# Patient Record
Sex: Female | Born: 1990 | Race: Black or African American | Hispanic: No | Marital: Single | Smoking: Former smoker
Health system: Southern US, Community
[De-identification: ages and names within clinical notes are randomized; demographics above are authoritative.]

## PROBLEM LIST (undated history)

## (undated) ENCOUNTER — Inpatient Hospital Stay (HOSPITAL_COMMUNITY): Payer: Self-pay

## (undated) DIAGNOSIS — N289 Disorder of kidney and ureter, unspecified: Secondary | ICD-10-CM

---
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
## 2011-02-01 ENCOUNTER — Emergency Department: Payer: Self-pay | Admitting: Emergency Medicine

## 2013-03-19 ENCOUNTER — Emergency Department: Payer: Self-pay | Admitting: Emergency Medicine

## 2013-03-19 LAB — COMPREHENSIVE METABOLIC PANEL
Albumin: 3.7 g/dL (ref 3.4–5.0)
Alkaline Phosphatase: 96 U/L (ref 50–136)
BUN: 8 mg/dL (ref 7–18)
Calcium, Total: 9.2 mg/dL (ref 8.5–10.1)
Chloride: 107 mmol/L (ref 98–107)
Co2: 24 mmol/L (ref 21–32)
EGFR (African American): >60
Glucose: 124 mg/dL — ABNORMAL HIGH (ref 65–99)
Osmolality: 274 (ref 275–301)
Potassium: 4 mmol/L (ref 3.5–5.1)
SGOT(AST): 20 U/L (ref 15–37)
Total Protein: 8.3 g/dL — ABNORMAL HIGH (ref 6.4–8.2)

## 2013-03-19 LAB — URINALYSIS, COMPLETE
Bilirubin,UR: NEGATIVE
Glucose,UR: NEGATIVE mg/dL (ref 0–75)
Ketone: NEGATIVE
Protein: 100
Squamous Epithelial: 6

## 2013-03-19 LAB — CBC
HGB: 12.7 g/dL (ref 12.0–16.0)
MCH: 27.8 pg (ref 26.0–34.0)
MCHC: 32.9 g/dL (ref 32.0–36.0)
Platelet: 221 10*3/uL (ref 150–440)
WBC: 11.8 10*3/uL — ABNORMAL HIGH (ref 3.6–11.0)

## 2013-03-19 LAB — LIPASE, BLOOD: Lipase: 99 U/L (ref 73–393)

## 2013-03-19 LAB — PREGNANCY, URINE: Pregnancy Test, Urine: NEGATIVE m[IU]/mL

## 2013-03-21 LAB — URINE CULTURE

## 2013-08-18 ENCOUNTER — Encounter (HOSPITAL_COMMUNITY): Payer: Self-pay | Admitting: Emergency Medicine

## 2013-08-18 ENCOUNTER — Emergency Department (HOSPITAL_COMMUNITY)
Admission: EM | Admit: 2013-08-18 | Discharge: 2013-08-19 | Disposition: A | Discharge status: Home / Self Care | Payer: Self-pay | Attending: Emergency Medicine | Admitting: Emergency Medicine

## 2013-08-18 DIAGNOSIS — N390 Urinary tract infection, site not specified: Secondary | ICD-10-CM | POA: Insufficient documentation

## 2013-08-18 DIAGNOSIS — Z880 Allergy status to penicillin: Secondary | ICD-10-CM | POA: Insufficient documentation

## 2013-08-18 DIAGNOSIS — Z87891 Personal history of nicotine dependence: Secondary | ICD-10-CM | POA: Insufficient documentation

## 2013-08-18 DIAGNOSIS — Z3490 Encounter for supervision of normal pregnancy, unspecified, unspecified trimester: Secondary | ICD-10-CM

## 2013-08-18 DIAGNOSIS — O2390 Unspecified genitourinary tract infection in pregnancy, unspecified trimester: Secondary | ICD-10-CM | POA: Insufficient documentation

## 2013-08-18 DIAGNOSIS — O219 Vomiting of pregnancy, unspecified: Secondary | ICD-10-CM

## 2013-08-18 DIAGNOSIS — O210 Mild hyperemesis gravidarum: Secondary | ICD-10-CM | POA: Insufficient documentation

## 2013-08-18 HISTORY — DX: Disorder of kidney and ureter, unspecified: N28.9

## 2013-08-18 LAB — URINALYSIS, ROUTINE W REFLEX MICROSCOPIC
Bilirubin Urine: NEGATIVE
GLUCOSE, UA: NEGATIVE mg/dL
Hgb urine dipstick: NEGATIVE
KETONES UR: NEGATIVE mg/dL
NITRITE: NEGATIVE
Protein, ur: NEGATIVE mg/dL
Specific Gravity, Urine: 1.023 (ref 1.005–1.030)
Urobilinogen, UA: 0.2 mg/dL (ref 0.0–1.0)
pH: 6 (ref 5.0–8.0)

## 2013-08-18 LAB — COMPREHENSIVE METABOLIC PANEL
ALK PHOS: 62 U/L (ref 39–117)
ALT: 12 U/L (ref 0–35)
AST: 16 U/L (ref 0–37)
Albumin: 3.7 g/dL (ref 3.5–5.2)
BUN: 8 mg/dL (ref 6–23)
CHLORIDE: 101 meq/L (ref 96–112)
CO2: 22 mEq/L (ref 19–32)
Calcium: 9.3 mg/dL (ref 8.4–10.5)
Creatinine, Ser: 0.66 mg/dL (ref 0.50–1.10)
GFR calc non Af Amer: >90 mL/min (ref >90)
GLUCOSE: 82 mg/dL (ref 70–99)
POTASSIUM: 3.9 meq/L (ref 3.7–5.3)
Sodium: 136 mEq/L — ABNORMAL LOW (ref 137–147)
Total Bilirubin: 0.2 mg/dL — ABNORMAL LOW (ref 0.3–1.2)
Total Protein: 8.1 g/dL (ref 6.0–8.3)

## 2013-08-18 LAB — CBC WITH DIFFERENTIAL/PLATELET
Basophils Absolute: 0 10*3/uL (ref 0.0–0.1)
Basophils Relative: 0 % (ref 0–1)
Eosinophils Absolute: 0.1 10*3/uL (ref 0.0–0.7)
Eosinophils Relative: 1 % (ref 0–5)
HCT: 37.3 % (ref 36.0–46.0)
HEMOGLOBIN: 12.6 g/dL (ref 12.0–15.0)
LYMPHS ABS: 2.8 10*3/uL (ref 0.7–4.0)
Lymphocytes Relative: 28 % (ref 12–46)
MCH: 28.8 pg (ref 26.0–34.0)
MCHC: 33.8 g/dL (ref 30.0–36.0)
MCV: 85.2 fL (ref 78.0–100.0)
MONOS PCT: 7 % (ref 3–12)
Monocytes Absolute: 0.6 10*3/uL (ref 0.1–1.0)
NEUTROS ABS: 6.4 10*3/uL (ref 1.7–7.7)
NEUTROS PCT: 64 % (ref 43–77)
Platelets: 259 10*3/uL (ref 150–400)
RBC: 4.38 MIL/uL (ref 3.87–5.11)
RDW: 15.1 % (ref 11.5–15.5)
WBC: 9.9 10*3/uL (ref 4.0–10.5)

## 2013-08-18 LAB — URINE MICROSCOPIC-ADD ON

## 2013-08-18 LAB — PREGNANCY, URINE: PREG TEST UR: POSITIVE — AB

## 2013-08-18 MED ORDER — PROMETHAZINE HCL 25 MG PO TABS: 25.0000 mg | ORAL_TABLET | Freq: Four times a day (QID) | ORAL | Status: DC | PRN

## 2013-08-18 MED ORDER — NITROFURANTOIN MONOHYD MACRO 100 MG PO CAPS: 100.0000 mg | ORAL_CAPSULE | Freq: Two times a day (BID) | ORAL | Status: DC

## 2013-08-18 MED ORDER — GI COCKTAIL ~~LOC~~
30.0000 mL | Freq: Once | ORAL | Status: AC
  Administered 2013-08-18: 30 mL via ORAL
  Filled 2013-08-18: qty 30

## 2013-08-18 MED ORDER — ONDANSETRON 8 MG PO TBDP: ORAL_TABLET | ORAL | Status: DC

## 2013-08-18 MED ORDER — FLUCONAZOLE 200 MG PO TABS: 200.0000 mg | ORAL_TABLET | Freq: Every day | ORAL | Status: AC

## 2013-08-18 MED ORDER — ONDANSETRON 4 MG PO TBDP
8.0000 mg | ORAL_TABLET | Freq: Once | ORAL | Status: AC
  Administered 2013-08-18: 8 mg via ORAL
  Filled 2013-08-18: qty 2

## 2013-08-18 MED ORDER — FLUCONAZOLE 200 MG PO TABS: 200.0000 mg | ORAL_TABLET | Freq: Every day | ORAL | Status: DC

## 2013-08-18 MED ORDER — PRENATAL COMPLETE 14-0.4 MG PO TABS: 1.0000 | ORAL_TABLET | Freq: Every day | ORAL | Status: DC

## 2013-08-18 NOTE — ED Provider Notes (Signed)
CSN: [id number]     Arrival date & time 08/18/13  1606 History   First MD Initiated Contact with Patient 08/18/13 2159     Chief Complaint  Patient presents with  . Abdominal Pain   HPI  History provided by the patient. Patient is a 23 year old female with no significant PMH who presents with complaints of continued intermittent abdominal pains with nausea and vomiting symptoms. Symptoms have been present for the past 3 weeks to one month. She reports intermittent episodes of nausea and vomiting throughout the day as well as upper abdominal pain and cramping. Patient states she is slightly concerned for possible pregnancy. Her last normal visual cycle was in November. She did take several home pregnancy tests which have been negative. She normally has fairly regular menstrual cycles each month. She denies having any small amounts of vaginal bleeding or any vaginal discharge. No vaginal irritations or pain. Denies any dysuria, hematuria urinary frequency. No diarrhea or constipation. No fever, chills or sweats. She has not taken any medications or treatment for symptoms. No other aggravating or alleviating factors. No other associated symptoms.    Past Medical History  Diagnosis Date  . Renal disorder    History reviewed. No pertinent past surgical history. No family history on file. History  Substance Use Topics  . Smoking status: Former Games developer  . Smokeless tobacco: Not on file  . Alcohol Use: No   OB History   Grav Para Term Preterm Abortions TAB SAB Ect Mult Living                 Review of Systems  Constitutional: Positive for appetite change. Negative for fever, chills, diaphoresis and fatigue.  Respiratory: Negative for shortness of breath.   Cardiovascular: Negative for chest pain.  Gastrointestinal: Positive for nausea, vomiting and abdominal pain.  Genitourinary: Negative for dysuria, frequency, hematuria, flank pain, vaginal bleeding and vaginal discharge.  All other  systems reviewed and are negative.    Allergies  Penicillins  Home Medications  No current outpatient prescriptions on file. BP 125/82  Pulse 61  Temp(Src) 98 F (36.7 C) (Oral)  Resp 20  Ht 5\' 9"  (1.753 m)  Wt 245 lb (111.131 kg)  BMI 36.16 kg/m2  SpO2 100%  LMP 06/16/2013 Physical Exam  Nursing note and vitals reviewed. Constitutional: She is oriented to person, place, and time. She appears well-developed and well-nourished. No distress.  HENT:  Head: Normocephalic.  Cardiovascular: Normal rate and regular rhythm.   Pulmonary/Chest: Effort normal and breath sounds normal. No respiratory distress. She has no wheezes. She has no rales.  Abdominal: Soft. There is tenderness in the left upper quadrant. There is no rigidity, no rebound, no guarding, no CVA tenderness, no tenderness at McBurney's point and negative Murphy's sign.  Musculoskeletal: Normal range of motion. She exhibits no edema and no tenderness.  Neurological: She is alert and oriented to person, place, and time.  Skin: Skin is warm and dry. No rash noted.  Psychiatric: She has a normal mood and affect. Her behavior is normal.    ED Course  Procedures   DIAGNOSTIC STUDIES: Oxygen Saturation is 100% on room air.    COORDINATION OF CARE:  Nursing notes reviewed. Vital signs reviewed. Initial pt interview and examination performed.   10:20 PM-patient seen and evaluated. She is well-appearing no acute distress. She has soft abdomen with mild pains in the left upper abdomen. No peritoneal signs. Discussed work up plan with pt at bedside, which includes lab  testing UA. I did also discuss options for pelvic exam despite lack of symptoms. Patient has refused this. Pt agrees with plan.  11:00 PM I discussed with patient her positive pregnancy test. I again offered further evaluation including possible ultrasound testing which she did not wish to have at this time. I did use a bedside ultrasound which demonstrated  appearance of intrauterine pregnancy. No detectable heartbeat at this time. Based on last menstrual period patient may be 4-6 weeks. I shortly encourage close followup with an OB/GYN specialist. [facility] She was also given strict return precautions and agrees.   Treatment plan initiated: Medications  ondansetron (ZOFRAN-ODT) disintegrating tablet 8 mg (8 mg Oral Given 08/18/13 2217)  gi cocktail (Maalox,Lidocaine,Donnatal) (30 mLs Oral Given 08/18/13 2217)   Results for orders placed during the hospital encounter of 08/18/13  CBC WITH DIFFERENTIAL      Result Value Range   WBC 9.9  4.0 - 10.5 K/uL   RBC 4.38  3.87 - 5.11 MIL/uL   Hemoglobin 12.6  12.0 - 15.0 g/dL   HCT 40.937.3  81.136.0 - 91.446.0 %   MCV 85.2  78.0 - 100.0 fL   MCH 28.8  26.0 - 34.0 pg   MCHC 33.8  30.0 - 36.0 g/dL   RDW 78.215.1  95.611.5 - 21.315.5 %   Platelets 259  150 - 400 K/uL   Neutrophils Relative % 64  43 - 77 %   Neutro Abs 6.4  1.7 - 7.7 K/uL   Lymphocytes Relative 28  12 - 46 %   Lymphs Abs 2.8  0.7 - 4.0 K/uL   Monocytes Relative 7  3 - 12 %   Monocytes Absolute 0.6  0.1 - 1.0 K/uL   Eosinophils Relative 1  0 - 5 %   Eosinophils Absolute 0.1  0.0 - 0.7 K/uL   Basophils Relative 0  0 - 1 %   Basophils Absolute 0.0  0.0 - 0.1 K/uL  COMPREHENSIVE METABOLIC PANEL      Result Value Range   Sodium 136 (*) 137 - 147 mEq/L   Potassium 3.9  3.7 - 5.3 mEq/L   Chloride 101  96 - 112 mEq/L   CO2 22  19 - 32 mEq/L   Glucose, Bld 82  70 - 99 mg/dL   BUN 8  6 - 23 mg/dL   Creatinine, Ser 0.860.66  0.50 - 1.10 mg/dL   Calcium 9.3  8.4 - 57.810.5 mg/dL   Total Protein 8.1  6.0 - 8.3 g/dL   Albumin 3.7  3.5 - 5.2 g/dL   AST 16  0 - 37 U/L   ALT 12  0 - 35 U/L   Alkaline Phosphatase 62  39 - 117 U/L   Total Bilirubin 0.2 (*) 0.3 - 1.2 mg/dL   GFR calc non Af Amer >90  >90 mL/min   GFR calc Af Amer >90  >90 mL/min  URINALYSIS, ROUTINE W REFLEX MICROSCOPIC      Result Value Range   Color, Urine  YELLOW  YELLOW   APPearance CLOUDY (*) CLEAR   Specific Gravity, Urine 1.023  1.005 - 1.030   pH 6.0  5.0 - 8.0   Glucose, UA NEGATIVE  NEGATIVE mg/dL   Hgb urine dipstick NEGATIVE  NEGATIVE   Bilirubin Urine NEGATIVE  NEGATIVE   Ketones, ur NEGATIVE  NEGATIVE mg/dL   Protein, ur NEGATIVE  NEGATIVE mg/dL   Urobilinogen, UA 0.2  0.0 - 1.0  mg/dL   Nitrite NEGATIVE  NEGATIVE   Leukocytes, UA MODERATE (*) NEGATIVE  URINE MICROSCOPIC-ADD ON      Result Value Range   Squamous Epithelial / LPF MANY (*) RARE   WBC, UA 21-50  <3 WBC/hpf   Bacteria, UA FEW (*) RARE   Urine-Other FEW YEAST        MDM   1. Pregnancy   2. Nausea/vomiting in pregnancy   3. UTI (lower urinary tract infection)        Angus Seller, PA-C 08/18/13 2333

## 2013-08-18 NOTE — ED Notes (Signed)
Pt reports intermittent abdominal pain x 3 weeks. Reports nausea, vomiting. No fevers, but reports chills. No vaginal discharge or urinary symptoms.

## 2013-08-18 NOTE — Discharge Instructions (Signed)
You were seen and evaluated for your abdominal discomforts and nausea and vomiting symptoms. Your testing today did not show any signs for an emergent condition to explain your symptoms. Your urine test did show a positive pregnancy and signs concerning for a urinary tract infection. Please use the medications prescribed to help treat your infection. Drink plenty of fluids stay hydrated. Followup with a primary care provider and OB/GYN for continued evaluation and treatment.     Morning Sickness Morning sickness is when you feel sick to your stomach (nauseous) during pregnancy. This nauseous feeling may or may not come with vomiting. It often occurs in the morning but can be a problem any time of day. Morning sickness is most common during the first trimester, but it may continue throughout pregnancy. While morning sickness is unpleasant, it is usually harmless unless you develop severe and continual vomiting (hyperemesis gravidarum). This condition requires more intense treatment.  CAUSES  The cause of morning sickness is not completely known but seems to be related to normal hormonal changes that occur in pregnancy. RISK FACTORS You are at greater risk if you:  Experienced nausea or vomiting before your pregnancy.  Had morning sickness during a previous pregnancy.  Are pregnant with more than one baby, such as twins. TREATMENT  Do not use any medicines (prescription, over-the-counter, or herbal) for morning sickness without first talking to your health care provider. Your health care provider may prescribe or recommend:  Vitamin B6 supplements.  Anti-nausea medicines.  The herbal medicine ginger. HOME CARE INSTRUCTIONS   Only take over-the-counter or prescription medicines as directed by your health care provider.  Taking multivitamins before getting pregnant can prevent or decrease the severity of morning sickness in most women.   Eat a piece of dry toast or unsalted crackers  before getting out of bed in the morning.   Eat five or six small meals a day.   Eat dry and bland foods (rice, baked potato). Foods high in carbohydrates are often helpful.  Do not drink liquids with your meals. Drink liquids between meals.   Avoid greasy, fatty, and spicy foods.   Get someone to cook for you if the smell of any food causes nausea and vomiting.   If you feel nauseous after taking prenatal vitamins, take the vitamins at night or with a snack.  Snack on protein foods (nuts, yogurt, cheese) between meals if you are hungry.   Eat unsweetened gelatins for desserts.   Wearing an acupressure wristband (worn for sea sickness) may be helpful.   Acupuncture may be helpful.   Do not smoke.   Get a humidifier to keep the air in your house free of odors.   Get plenty of fresh air. SEEK MEDICAL CARE IF:   Your home remedies are not working, and you need medicine.  You feel dizzy or lightheaded.  You are losing weight. SEEK IMMEDIATE MEDICAL CARE IF:   You have persistent and uncontrolled nausea and vomiting.  You pass out (faint). Document Released: 08/31/2006 Document Revised: 03/12/2013 Document Reviewed: 12/25/2012 [facility] Patient Information 2014 [place], [place].     Urinary Tract Infection Urinary tract infections (UTIs) can develop anywhere along your urinary tract. Your urinary tract is your body's drainage system for removing wastes and extra water. Your urinary tract includes two kidneys, two ureters, a bladder, and a urethra. Your kidneys are a pair of bean-shaped organs. Each kidney is about the size of your fist. They are located below your ribs, one on  each side of your spine. CAUSES Infections are caused by microbes, which are microscopic organisms, including fungi, viruses, and bacteria. These organisms are so small that they can only be seen through a microscope. Bacteria are the microbes that most commonly cause UTIs. SYMPTOMS    Symptoms of UTIs may vary by age and gender of the patient and by the location of the infection. Symptoms in young women typically include a frequent and intense urge to urinate and a painful, burning feeling in the bladder or urethra during urination. Older women and men are more likely to be tired, shaky, and weak and have muscle aches and abdominal pain. A fever may mean the infection is in your kidneys. Other symptoms of a kidney infection include pain in your back or sides below the ribs, nausea, and vomiting. DIAGNOSIS To diagnose a UTI, your caregiver will ask you about your symptoms. Your caregiver also will ask to provide a urine sample. The urine sample will be tested for bacteria and white blood cells. White blood cells are made by your body to help fight infection. TREATMENT  Typically, UTIs can be treated with medication. Because most UTIs are caused by a bacterial infection, they usually can be treated with the use of antibiotics. The choice of antibiotic and length of treatment depend on your symptoms and the type of bacteria causing your infection. HOME CARE INSTRUCTIONS  If you were prescribed antibiotics, take them exactly as your caregiver instructs you. Finish the medication even if you feel better after you have only taken some of the medication.  Drink enough water and fluids to keep your urine clear or pale yellow.  Avoid caffeine, tea, and carbonated beverages. They tend to irritate your bladder.  Empty your bladder often. Avoid holding urine for long periods of time.  Empty your bladder before and after sexual intercourse.  After a bowel movement, women should cleanse from front to back. Use each tissue only once. SEEK MEDICAL CARE IF:   You have back pain.  You develop a fever.  Your symptoms do not begin to resolve within 3 days. SEEK IMMEDIATE MEDICAL CARE IF:   You have severe back pain or lower abdominal pain.  You develop chills.  You have nausea or  vomiting.  You have continued burning or discomfort with urination. MAKE SURE YOU:   Understand these instructions.  Will watch your condition.  Will get help right away if you are not doing well or get worse. Document Released: 04/19/2005 Document Revised: 01/09/2012 Document Reviewed: 08/18/2011 [facility] Patient Information 2014 [place].

## 2013-08-18 NOTE — ED Notes (Signed)
Pt c/o pain in her upper to lower abdomen x1 month, Pt took home pregnancy test but was negative, pt c/o n/v

## 2013-08-19 LAB — URINE CULTURE

## 2013-08-21 ENCOUNTER — Inpatient Hospital Stay (HOSPITAL_COMMUNITY)
Admission: AD | Admit: 2013-08-21 | Discharge: 2013-08-21 | Disposition: A | Discharge status: Home / Self Care | Payer: Self-pay | Source: Ambulatory Visit | Attending: Obstetrics & Gynecology | Admitting: Obstetrics & Gynecology

## 2013-08-21 ENCOUNTER — Encounter (HOSPITAL_COMMUNITY): Payer: Self-pay

## 2013-08-21 ENCOUNTER — Inpatient Hospital Stay (HOSPITAL_COMMUNITY): Payer: Self-pay

## 2013-08-21 DIAGNOSIS — R1032 Left lower quadrant pain: Secondary | ICD-10-CM | POA: Insufficient documentation

## 2013-08-21 DIAGNOSIS — O2390 Unspecified genitourinary tract infection in pregnancy, unspecified trimester: Secondary | ICD-10-CM | POA: Insufficient documentation

## 2013-08-21 DIAGNOSIS — R109 Unspecified abdominal pain: Secondary | ICD-10-CM

## 2013-08-21 DIAGNOSIS — B3731 Acute candidiasis of vulva and vagina: Secondary | ICD-10-CM | POA: Insufficient documentation

## 2013-08-21 DIAGNOSIS — N760 Acute vaginitis: Secondary | ICD-10-CM | POA: Insufficient documentation

## 2013-08-21 DIAGNOSIS — B373 Candidiasis of vulva and vagina: Secondary | ICD-10-CM

## 2013-08-21 DIAGNOSIS — Z87891 Personal history of nicotine dependence: Secondary | ICD-10-CM | POA: Insufficient documentation

## 2013-08-21 DIAGNOSIS — O26899 Other specified pregnancy related conditions, unspecified trimester: Secondary | ICD-10-CM

## 2013-08-21 DIAGNOSIS — B9689 Other specified bacterial agents as the cause of diseases classified elsewhere: Secondary | ICD-10-CM | POA: Insufficient documentation

## 2013-08-21 DIAGNOSIS — A499 Bacterial infection, unspecified: Secondary | ICD-10-CM | POA: Insufficient documentation

## 2013-08-21 LAB — URINALYSIS, ROUTINE W REFLEX MICROSCOPIC
Bilirubin Urine: NEGATIVE
Glucose, UA: NEGATIVE mg/dL
HGB URINE DIPSTICK: NEGATIVE
KETONES UR: NEGATIVE mg/dL
NITRITE: NEGATIVE
PH: 6 (ref 5.0–8.0)
Protein, ur: NEGATIVE mg/dL
Specific Gravity, Urine: 1.015 (ref 1.005–1.030)
Urobilinogen, UA: 0.2 mg/dL (ref 0.0–1.0)

## 2013-08-21 LAB — WET PREP, GENITAL: TRICH WET PREP: NONE SEEN

## 2013-08-21 LAB — URINE MICROSCOPIC-ADD ON

## 2013-08-21 MED ORDER — METRONIDAZOLE 500 MG PO TABS: 500.0000 mg | ORAL_TABLET | Freq: Two times a day (BID) | ORAL | Status: DC

## 2013-08-21 NOTE — Discharge Instructions (Signed)
Pregnancy - First Trimester During sexual intercourse, millions of sperm go into the vagina. Only 1 sperm will penetrate and fertilize the female egg while it is in the Fallopian tube. One week later, the fertilized egg implants into the wall of the uterus. An embryo begins to develop into a baby. At 6 to 8 weeks, the eyes and face are formed and the heartbeat can be seen on ultrasound. At the end of 12 weeks (first trimester), all the baby's organs are formed. Now that you are pregnant, you will want to do everything you can to have a healthy baby. Two of the most important things are to get good prenatal care and follow your caregiver's instructions. Prenatal care is all the medical care you receive before the baby's birth. It is given to prevent, find, and treat problems during the pregnancy and childbirth. PRENATAL EXAMS  During prenatal visits, your weight, blood pressure, and urine are checked. This is done to make sure you are healthy and progressing normally during the pregnancy.  A pregnant woman should gain 25 to 35 pounds during the pregnancy. However, if you are overweight or underweight, your caregiver will advise you regarding your weight.  Your caregiver will ask and answer questions for you.  Blood work, cervical cultures, other necessary tests, and a Pap test are done during your prenatal exams. These tests are done to check on your health and the probable health of your baby. Tests are strongly recommended and done for HIV with your permission. This is the virus that causes AIDS. These tests are done because medicines can be given to help prevent your baby from being born with this infection should you have been infected without knowing it. Blood work is also used to find out your blood type, previous infections, and follow your blood levels (hemoglobin).  Low hemoglobin (anemia) is common during pregnancy. Iron and vitamins are given to help prevent this. Later in the pregnancy, blood  tests for diabetes will be done along with any other tests if any problems develop.  You may need other tests to make sure you and the baby are doing well. CHANGES DURING THE FIRST TRIMESTER  Your body goes through many changes during pregnancy. They vary from person to person. Talk to your caregiver about changes you notice and are concerned about. Changes can include:  Your menstrual period stops.  The egg and sperm carry the genes that determine what you look like. Genes from you and your partner are forming a baby. The female genes determine whether the baby is a boy or a girl.  Your body increases in girth and you may feel bloated.  Feeling sick to your stomach (nauseous) and throwing up (vomiting). If the vomiting is uncontrollable, call your caregiver.  Your breasts will begin to enlarge and become tender.  Your nipples may stick out more and become darker.  The need to urinate more. Painful urination may mean you have a bladder infection.  Tiring easily.  Loss of appetite.  Cravings for certain kinds of food.  At first, you may gain or lose a couple of pounds.  You may have changes in your emotions from day to day (excited to be pregnant or concerned something may go wrong with the pregnancy and baby).  You may have more vivid and strange dreams. HOME CARE INSTRUCTIONS   It is very important to avoid all smoking, alcohol and non-prescribed drugs during your pregnancy. These affect the formation and growth of the baby.  Avoid chemicals while pregnant to ensure the delivery of a healthy infant. °· Start your prenatal visits by the 12th week of pregnancy. They are usually scheduled monthly at first, then more often in the last 2 months before delivery. Keep your caregiver's appointments. Follow your caregiver's instructions regarding medicine use, blood and lab tests, exercise, and diet. °· During pregnancy, you are providing food for you and your baby. Eat regular, well-balanced  meals. Choose foods such as meat, fish, milk and other low fat dairy products, vegetables, fruits, and whole-grain breads and cereals. Your caregiver will tell you of the ideal weight gain. °· You can help morning sickness by keeping soda crackers at the bedside. Eat a couple before arising in the morning. You may want to use the crackers without salt on them. °· Eating 4 to 5 small meals rather than 3 large meals a day also may help the nausea and vomiting. °· Drinking liquids between meals instead of during meals also seems to help nausea and vomiting. °· A physical sexual relationship may be continued throughout pregnancy if there are no other problems. Problems may be early (premature) leaking of amniotic fluid from the membranes, vaginal bleeding, or belly (abdominal) pain. °· Exercise regularly if there are no restrictions. Check with your caregiver or physical therapist if you are unsure of the safety of some of your exercises. Greater weight gain will occur in the last 2 trimesters of pregnancy. Exercising will help: °· Control your weight. °· Keep you in shape. °· Prepare you for labor and delivery. °· Help you lose your pregnancy weight after you deliver your baby. °· Wear a good support or jogging bra for breast tenderness during pregnancy. This may help if worn during sleep too. °· Ask when prenatal classes are available. Begin classes when they are offered. °· Do not use hot tubs, steam rooms, or saunas. °· Wear your seat belt when driving. This protects you and your baby if you are in an accident. °· Avoid raw meat, uncooked cheese, cat litter boxes, and soil used by cats throughout the pregnancy. These carry germs that can cause birth defects in the baby. °· The first trimester is a good time to visit your dentist for your dental health. Getting your teeth cleaned is okay. Use a softer toothbrush and brush gently during pregnancy. °· Ask for help if you have financial, counseling, or nutritional needs  during pregnancy. Your caregiver will be able to offer counseling for these needs as well as refer you for other special needs. °· Do not take any medicines or herbs unless told by your caregiver. °· Inform your caregiver if there is any mental or physical domestic violence. °· Make a list of emergency phone numbers of family, friends, hospital, and police and fire departments. °· Write down your questions. Take them to your prenatal visit. °· Do not douche. °· Do not cross your legs. °· If you have to stand for long periods of time, rotate you feet or take small steps in a circle. °· You may have more vaginal secretions that may require a sanitary pad. Do not use tampons or scented sanitary pads. °MEDICINES AND DRUG USE IN PREGNANCY °· Take prenatal vitamins as directed. The vitamin should contain 1 milligram of folic acid. Keep all vitamins out of reach of children. Only a couple vitamins or tablets containing iron may be fatal to a baby or young child when ingested. °· Avoid use of all medicines, including herbs, over-the-counter medicines, not   prescribed or suggested by your caregiver. Only take over-the-counter or prescription medicines for pain, discomfort, or fever as directed by your caregiver. Do not use aspirin, ibuprofen, or naproxen unless directed by your caregiver.  Let your caregiver also know about herbs you may be using.  Alcohol is related to a number of birth defects. This includes fetal alcohol syndrome. All alcohol, in any form, should be avoided completely. Smoking will cause low birth rate and premature babies.  Street or illegal drugs are very harmful to the baby. They are absolutely forbidden. A baby born to an addicted mother will be addicted at birth. The baby will go through the same withdrawal an adult does.  Let your caregiver know about any medicines that you have to take and for what reason you take them. SEEK MEDICAL CARE IF:  You have any concerns or worries during your  pregnancy. It is better to call with your questions if you feel they cannot wait, rather than worry about them. SEEK IMMEDIATE MEDICAL CARE IF:   An unexplained oral temperature above 102 F (38.9 C) develops, or as your caregiver suggests.  You have leaking of fluid from the vagina (birth canal). If leaking membranes are suspected, take your temperature and inform your caregiver of this when you call.  There is vaginal spotting or bleeding. Notify your caregiver of the amount and how many pads are used.  You develop a bad smelling vaginal discharge with a change in the color.  You continue to feel sick to your stomach (nauseated) and have no relief from remedies suggested. You vomit blood or coffee ground-like materials.  You lose more than 2 pounds of weight in 1 week.  You gain more than 2 pounds of weight in 1 week and you notice swelling of your face, hands, feet, or legs.  You gain 5 pounds or more in 1 week (even if you do not have swelling of your hands, face, legs, or feet).  You get exposed to [place] measles and have never had them.  You are exposed to fifth disease or chickenpox.  You develop belly (abdominal) pain. Round ligament discomfort is a common non-cancerous (benign) cause of abdominal pain in pregnancy. Your caregiver still must evaluate this.  You develop headache, fever, diarrhea, pain with urination, or shortness of breath.  You fall or are in a car accident or have any kind of trauma.  There is mental or physical violence in your home. Document Released: 07/04/2001 Document Revised: 04/03/2012 Document Reviewed: 01/05/2009 [facility] Patient Information 2014 [employer] [place]. Monilial Vaginitis Vaginitis in a soreness, swelling and redness (inflammation) of the vagina and vulva. Monilial vaginitis is not a sexually transmitted infection. CAUSES  Yeast vaginitis is caused by yeast (candida) that is normally found in your vagina. With a yeast infection, the  candida has overgrown in number to a point that upsets the chemical balance. SYMPTOMS   White, thick vaginal discharge.  Swelling, itching, redness and irritation of the vagina and possibly the lips of the vagina (vulva).  Burning or painful urination.  Painful intercourse. DIAGNOSIS  Things that may contribute to monilial vaginitis are:  Postmenopausal and virginal states.  Pregnancy.  Infections.  Being tired, sick or stressed, especially if you had monilial vaginitis in the past.  Diabetes. Good control will help lower the chance.  Birth control pills.  Tight fitting garments.  Using bubble bath, feminine sprays, douches or deodorant tampons.  Taking certain medications that kill germs (antibiotics).  Sporadic recurrence can  occur if you become ill. TREATMENT  Your caregiver will give you medication.  There are several kinds of anti monilial vaginal creams and suppositories specific for monilial vaginitis. For recurrent yeast infections, use a suppository or cream in the vagina 2 times a week, or as directed.  Anti-monilial or steroid cream for the itching or irritation of the vulva may also be used. Get your caregiver's permission.  Painting the vagina with methylene blue solution may help if the monilial cream does not work.  Eating yogurt may help prevent monilial vaginitis. HOME CARE INSTRUCTIONS   Finish all medication as prescribed.  Do not have sex until treatment is completed or after your caregiver tells you it is okay.  Take warm sitz baths.  Do not douche.  Do not use tampons, especially scented ones.  Wear cotton underwear.  Avoid tight pants and panty hose.  Tell your sexual partner that you have a yeast infection. They should go to their caregiver if they have symptoms such as mild rash or itching.  Your sexual partner should be treated as well if your infection is difficult to eliminate.  Practice safer sex. Use condoms.  Some vaginal  medications cause latex condoms to fail. Vaginal medications that harm condoms are:  Cleocin cream.  Butoconazole (Femstat).  Terconazole (Terazol) vaginal suppository.  Miconazole (Monistat) (may be purchased over the counter). SEEK MEDICAL CARE IF:   You have a temperature by mouth above 102 F (38.9 C).  The infection is getting worse after 2 days of treatment.  The infection is not getting better after 3 days of treatment.  You develop blisters in or around your vagina.  You develop vaginal bleeding, and it is not your menstrual period.  You have pain when you urinate.  You develop intestinal problems.  You have pain with sexual intercourse. Document Released: 04/19/2005 Document Revised: 10/02/2011 Document Reviewed: 01/01/2009 [facility] Patient Information 2014 [place], [place].

## 2013-08-21 NOTE — MAU Provider Note (Signed)
History     CSN: [id number]  Arrival date and time: 08/21/13 7829   First Provider Initiated Contact with Patient 08/21/13 0920      Chief Complaint  Patient presents with  . Abdominal Pain   HPI Shannon Barnett is a 23 y.o. G1P0 at [redacted]w[redacted]d who presented to MAU c/o LLQ abdominal pain x 3 days.  Constant dull feeling.  Has not tried meds for this pain.  Admits to chills.  Denies vaginal bleeding, vaginal discharge, nausea, vomiting, fever, diarrhea, constipation.  Seen in ED 3 days ago where she was found to be pregnant.  Offered [place] in ED  but she did not want to stay.    Past Medical History  Diagnosis Date  . Renal disorder     History reviewed. No pertinent past surgical history.  History reviewed. No pertinent family history.  History  Substance Use Topics  . Smoking status: Former Games developer  . Smokeless tobacco: Not on file  . Alcohol Use: No    Allergies:  Allergies  Allergen Reactions  . Penicillins Anaphylaxis    Prescriptions prior to admission  Medication Sig Dispense Refill  . fluconazole (DIFLUCAN) 200 MG tablet Take 1 tablet (200 mg total) by mouth daily.  3 tablet  0  . nitrofurantoin, macrocrystal-monohydrate, (MACROBID) 100 MG capsule Take 1 capsule (100 mg total) by mouth 2 (two) times daily.  14 capsule  0  . ondansetron (ZOFRAN ODT) 8 MG disintegrating tablet 8mg  ODT q4 hours prn nausea  20 tablet  0  . Prenatal Vit-Fe Fumarate-FA (PRENATAL COMPLETE) 14-0.4 MG TABS Take 1 tablet by mouth daily.  60 each  0  . promethazine (PHENERGAN) 25 MG tablet Take 1 tablet (25 mg total) by mouth every 6 (six) hours as needed for nausea.  20 tablet  0    Review of Systems  All other systems reviewed and are negative.   Physical Exam   Blood pressure 122/79, pulse 71, temperature 98.1 F (36.7 C), temperature source Oral, resp. rate 18, last menstrual period 06/16/2013.  Physical Exam  Constitutional: She is oriented to person, place, and time. She appears  well-developed and well-nourished. No distress.  HENT:  Head: Normocephalic and atraumatic.  Cardiovascular: Normal rate.   Respiratory: Effort normal. No respiratory distress.  GI: Soft. She exhibits no distension and no mass. There is tenderness (TTP in LLQ). There is no rebound and no guarding.  Genitourinary:  Uterus enlarged.  White, thick discharge present in vaginal canal.  No active bleeding.  Non-tender adnexa with no masses appreciated.  Neurological: She is alert and oriented to person, place, and time.  Skin: Skin is warm and dry. No rash noted.  Psychiatric: She has a normal mood and affect. Her behavior is normal.    MAU Course  Procedures  MDM [place] UA, Wet Prep, GC/Chlamydia  Assessment and Plan  #22 y.o. female G93P0 [redacted]w[redacted]d #Bacterial Vaginosis #Yeast Vaginitis  - Single IUP at [redacted]w[redacted]d; changed dates from LMP estimating GA at [redacted]w[redacted]d - +FHR - UA+ for yeast and BV; Rx previously given in ED for Diflucan; will write for Flagyl today - Recommended starting PNV and establishing PNC   TUCKER, BRITTON L 08/21/2013, 10:04 AM   Seen by me also Agree with note   Medication List    STOP taking these medications       nitrofurantoin (macrocrystal-monohydrate) 100 MG capsule  Commonly known as:  MACROBID      TAKE these medications  fluconazole 200 MG tablet  Commonly known as:  DIFLUCAN  Take 1 tablet (200 mg total) by mouth daily.     metroNIDAZOLE 500 MG tablet  Commonly known as:  FLAGYL  Take 1 tablet (500 mg total) by mouth 2 (two) times daily.     ondansetron 8 MG disintegrating tablet  Commonly known as:  ZOFRAN ODT  8mg  ODT q4 hours prn nausea     PRENATAL COMPLETE 14-0.4 MG Tabs  Take 1 tablet by mouth daily.     promethazine 25 MG tablet  Commonly known as:  PHENERGAN  Take 1 tablet (25 mg total) by mouth every 6 (six) hours as needed for nausea.       Aviva SignsMarie L Williams, CNM

## 2013-08-21 NOTE — ED Provider Notes (Signed)
Medical screening examination/treatment/procedure(s) were performed by non-physician practitioner and as supervising physician I was immediately available for consultation/collaboration.  EKG Interpretation   None         Joya Gaskinsonald W Rukia Mcgillivray, MD 08/21/13 1143

## 2013-08-21 NOTE — MAU Note (Signed)
Pt presents complaining of lower abdominal pain that started Monday. [facility] ultrasound and pelvic exam. Denies vaginal bleeding or discharge.

## 2013-08-22 LAB — GC/CHLAMYDIA PROBE AMP
CT PROBE, AMP APTIMA: NEGATIVE
GC PROBE AMP APTIMA: NEGATIVE

## 2013-09-11 ENCOUNTER — Inpatient Hospital Stay (HOSPITAL_COMMUNITY): Payer: Self-pay

## 2013-09-11 ENCOUNTER — Inpatient Hospital Stay (HOSPITAL_COMMUNITY)
Admission: AD | Admit: 2013-09-11 | Discharge: 2013-09-11 | Disposition: A | Discharge status: Home / Self Care | Payer: Self-pay | Source: Ambulatory Visit | Attending: Obstetrics & Gynecology | Admitting: Obstetrics & Gynecology

## 2013-09-11 DIAGNOSIS — O209 Hemorrhage in early pregnancy, unspecified: Secondary | ICD-10-CM | POA: Insufficient documentation

## 2013-09-11 DIAGNOSIS — R109 Unspecified abdominal pain: Secondary | ICD-10-CM | POA: Insufficient documentation

## 2013-09-11 DIAGNOSIS — Z87891 Personal history of nicotine dependence: Secondary | ICD-10-CM | POA: Insufficient documentation

## 2013-09-11 LAB — CBC
HCT: 35.6 % — ABNORMAL LOW (ref 36.0–46.0)
Hemoglobin: 11.8 g/dL — ABNORMAL LOW (ref 12.0–15.0)
MCH: 27.9 pg (ref 26.0–34.0)
MCHC: 33.1 g/dL (ref 30.0–36.0)
MCV: 84.2 fL (ref 78.0–100.0)
PLATELETS: 162 10*3/uL (ref 150–400)
RBC: 4.23 MIL/uL (ref 3.87–5.11)
RDW: 15.1 % (ref 11.5–15.5)
WBC: 8.1 10*3/uL (ref 4.0–10.5)

## 2013-09-11 LAB — HCG, QUANTITATIVE, PREGNANCY: hCG, Beta Chain, Quant, S: 6116 m[IU]/mL — ABNORMAL HIGH (ref <5)

## 2013-09-11 LAB — ABO/RH: ABO/RH(D): O POS

## 2013-09-11 MED ORDER — KETOROLAC TROMETHAMINE 60 MG/2ML IM SOLN
60.0000 mg | Freq: Once | INTRAMUSCULAR | Status: AC
  Administered 2013-09-11: 60 mg via INTRAMUSCULAR
  Filled 2013-09-11: qty 2

## 2013-09-11 NOTE — MAU Provider Note (Signed)
Attestation of Attending Supervision of Advanced Practitioner (PA/CNM/NP): Evaluation and management procedures were performed by the Advanced Practitioner under my supervision and collaboration.  I have reviewed the Advanced Practitioner's note and chart, and I agree with the management and plan.  Christyna Letendre, MD, FACOG Attending Obstetrician & Gynecologist Faculty Practice, [facility]  

## 2013-09-11 NOTE — MAU Provider Note (Signed)
History     CSN: [id number]  Arrival date and time: 09/11/13 1137   First Provider Initiated Contact with Patient 09/11/13 1204      Chief Complaint  Patient presents with  . Vaginal Bleeding   HPI Pt is 7834w6d pregnant G1P0 who was seen on 08/11/2013 with lower abd dull pain- US showed viable embryo, measuriing smaller than dates Started spotting on Tues night. Has increased. Cramping in lower abd.  Past Medical History  Diagnosis Date  . Renal disorder     No past surgical history on file.  No family history on file.  History  Substance Use Topics  . Smoking status: Former Games developermoker  . Smokeless tobacco: Not on file  . Alcohol Use: No    Allergies:  Allergies  Allergen Reactions  . Penicillins Anaphylaxis    Prescriptions prior to admission  Medication Sig Dispense Refill  . Prenatal Vit-Fe Fumarate-FA (PRENATAL MULTIVITAMIN) TABS tablet Take 1 tablet by mouth daily at 12 noon.        Review of Systems  Constitutional: Negative for fever and chills.  Gastrointestinal: Positive for abdominal pain. Negative for nausea, vomiting, diarrhea and constipation.  Genitourinary: Negative for dysuria and urgency.   Physical Exam   Blood pressure 119/79, pulse 83, temperature 98.3 F (36.8 C), temperature source Oral, resp. rate 16, height 5' 8.5" (1.74 m), weight 113.399 kg (250 lb), last menstrual period 06/16/2013, SpO2 100.00%.  Physical Exam  Nursing note and vitals reviewed. Constitutional: She is oriented to person, place, and time. She appears well-developed and well-nourished. No distress.  HENT:  Head: Normocephalic.  Eyes: Pupils are equal, round, and reactive to light.  Neck: Normal range of motion. Neck supple.  Cardiovascular: Normal rate.   Respiratory: Effort normal.  GI: Soft. She exhibits no distension. There is no tenderness. There is no rebound and no guarding.  Genitourinary: Vagina normal.  Cervix closed; sm- mod blood in vault   Musculoskeletal: Normal range of motion.  Neurological: She is alert and oriented to person, place, and time.  Skin: Skin is warm and dry.  Psychiatric: She has a normal mood and affect.    MAU Course  Procedures Results for orders placed during the hospital encounter of 09/11/13 (from the past 24 hour(s))  CBC     Status: Abnormal   Collection Time    09/11/13 12:24 PM      Result Value Ref Range   WBC 8.1  4.0 - 10.5 K/uL   RBC 4.23  3.87 - 5.11 MIL/uL   Hemoglobin 11.8 (*) 12.0 - 15.0 g/dL   HCT 78.235.6 (*) 95.636.0 - 21.346.0 %   MCV 84.2  78.0 - 100.0 fL   MCH 27.9  26.0 - 34.0 pg   MCHC 33.1  30.0 - 36.0 g/dL   RDW 08.615.1  57.811.5 - 46.915.5 %   Platelets 162  150 - 400 K/uL  ABO/RH     Status: None   Collection Time    09/11/13 12:24 PM      Result Value Ref Range   ABO/RH(D) O POS    HCG, QUANTITATIVE, PREGNANCY     Status: Abnormal   Collection Time    09/11/13 12:24 PM      Result Value Ref Range   hCG, Beta Chain, Quant, S 6116 (*) <5 mIU/mL  [place] Ob Comp Less 14 Wks  09/11/2013   CLINICAL DATA:  Pelvic pain. Vaginal bleeding. 10week 6 day gestational age by prior ultrasound.  EXAM: OBSTETRIC <  14 WK ULTRASOUND  TECHNIQUE: Transabdominal ultrasound was performed for evaluation of the gestation as well as the maternal uterus and adnexal regions.  COMPARISON:  08/21/2013  FINDINGS: Intrauterine gestational sac: Visualized/normal in shape.  Yolk sac:  Not visualized  Embryo:  Visualized  Cardiac Activity: Absent  CRL:   16  mm   8 w 1 d                  [place] EDC: 04/22/2014  Maternal uterus/adnexae: Both ovaries are normal in appearance. No mass or free fluid identified.  IMPRESSION: Findings meet definitive criteria for failed pregnancy. This follows SRU consensus guidelines: Diagnostic Criteria for Nonviable Pregnancy Early in the First Trimester. [facility] J Med [phone].   Electronically Signed   By: Myles Rosenthal M.D.   On: 09/11/2013 13:42  discussed with pt and partner of failed  pregnancy Discussed options of management with pt and partner Pt and partner want re-evaluation Will bring back in 1 week for follow up ultrasound per pt and partner Return to MAU if increase in pain or bleeding Discussed with Dr. Macon Large    Assessment and Plan  Bleeding in pregnancy Failed pregnancy with desired pregnancy F/u in 1 week for repeat ultrasound Return sooner for increase in pain or bleeding  Kaoir Loree 09/11/2013, 2:30 PM

## 2013-09-11 NOTE — MAU Note (Signed)
Started spotting on Tues night.  Has increased.  Cramping in lower abd.

## 2013-09-12 ENCOUNTER — Encounter (HOSPITAL_COMMUNITY): Payer: Self-pay | Admitting: *Deleted

## 2013-09-12 ENCOUNTER — Inpatient Hospital Stay (HOSPITAL_COMMUNITY)
Admission: AD | Admit: 2013-09-12 | Discharge: 2013-09-12 | Disposition: A | Discharge status: Home / Self Care | Payer: Self-pay | Source: Ambulatory Visit | Attending: Obstetrics & Gynecology | Admitting: Obstetrics & Gynecology

## 2013-09-12 DIAGNOSIS — Z87891 Personal history of nicotine dependence: Secondary | ICD-10-CM | POA: Insufficient documentation

## 2013-09-12 DIAGNOSIS — O039 Complete or unspecified spontaneous abortion without complication: Secondary | ICD-10-CM

## 2013-09-12 DIAGNOSIS — R109 Unspecified abdominal pain: Secondary | ICD-10-CM | POA: Insufficient documentation

## 2013-09-12 MED ORDER — KETOROLAC TROMETHAMINE 60 MG/2ML IM SOLN
60.0000 mg | Freq: Once | INTRAMUSCULAR | Status: AC
  Administered 2013-09-12: 60 mg via INTRAMUSCULAR
  Filled 2013-09-12: qty 2

## 2013-09-12 MED ORDER — PROMETHAZINE HCL 25 MG PO TABS: 25.0000 mg | ORAL_TABLET | Freq: Four times a day (QID) | ORAL | Status: DC | PRN

## 2013-09-12 MED ORDER — HYDROMORPHONE HCL PF 1 MG/ML IJ SOLN
1.0000 mg | Freq: Once | INTRAMUSCULAR | Status: AC
  Administered 2013-09-12: 1 mg via INTRAMUSCULAR
  Filled 2013-09-12: qty 1

## 2013-09-12 MED ORDER — OXYCODONE-ACETAMINOPHEN 5-325 MG PO TABS: 2.0000 | ORAL_TABLET | ORAL | Status: DC | PRN

## 2013-09-12 NOTE — MAU Provider Note (Signed)
  History     CSN: [id number]  Arrival date and time: 09/12/13 1757   First Provider Initiated Contact with Patient 09/12/13 1826      Chief Complaint  Patient presents with  . Vaginal Bleeding  . Abdominal Cramping   HPI Comments: Shannon Barnett 23 y.o. G1P0 presents to MAU with worsening pain and bleeding from yesterday. She was seen in MAU yesterday and told she was having a miscarriage. The pain started to get worse last night and all today was "10" on 1/10 scale. She is O positive blood type.  Vaginal Bleeding Associated symptoms include abdominal pain.  Abdominal Cramping     Past Medical History  Diagnosis Date  . Renal disorder     History reviewed. No pertinent past surgical history.  History reviewed. No pertinent family history.  History  Substance Use Topics  . Smoking status: Former Games developermoker  . Smokeless tobacco: Not on file  . Alcohol Use: No    Allergies:  Allergies  Allergen Reactions  . Penicillins Anaphylaxis    Prescriptions prior to admission  Medication Sig Dispense Refill  . ibuprofen (ADVIL,MOTRIN) 200 MG tablet Take 200 mg by mouth as needed for moderate pain.      . Prenatal Vit-Fe Fumarate-FA (PRENATAL MULTIVITAMIN) TABS tablet Take 1 tablet by mouth daily at 12 noon.        Review of Systems  Constitutional: Negative.   HENT: Negative.   Eyes: Negative.   Respiratory: Negative.   Cardiovascular: Negative.   Gastrointestinal: Positive for abdominal pain.  Genitourinary: Negative.   Musculoskeletal: Negative.   Neurological: Negative.   Endo/Heme/Allergies: Negative.   Psychiatric/Behavioral: Negative.    Physical Exam   Blood pressure 122/73, pulse 85, temperature 97.4 F (36.3 C), temperature source Oral, resp. rate 18, height 5\' 9"  (1.753 m), weight 113.399 kg (250 lb), last menstrual period 06/16/2013.  Physical Exam  Constitutional: She is oriented to person, place, and time. She appears well-developed and well-nourished.  She appears distressed.  HENT:  Head: Normocephalic and atraumatic.  Eyes: Pupils are equal, round, and reactive to light.  Cardiovascular: Normal rate and regular rhythm.   Respiratory: Effort normal and breath sounds normal.  GI: There is tenderness.  Genitourinary:  Genital: bloody Vaginal:products of conception/ blood Cervix:closed Bimanual:tender   Musculoskeletal: Normal range of motion.  Neurological: She is alert and oriented to person, place, and time.  Skin: Skin is warm and dry.  Psychiatric: She has a normal mood and affect. Her behavior is normal. Judgment and thought content normal.    MAU Course  Procedures  MDM  Unable to examine patient due to pain/ will give Dilaudid and Toradol IM Pain   Assessment and Plan   A: Miscarriage  P: Send POC to pathology Percocet/ phenergan for home Continue PNV for 1-2 months Do not attempt pregnancy for 3 menses cycles Return as needed to MAU Follow up in Clinic for repeat Quant in one week  Carolynn ServeBarefoot, Jenan Ellegood Miller 09/12/2013, 7:14 PM

## 2013-09-12 NOTE — Discharge Instructions (Signed)
Miscarriage  A miscarriage is the loss of an unborn baby (fetus) before the 20th week of pregnancy. The cause is often unknown.   HOME CARE  · You may need to stay in bed (bed rest), or you may be able to do light activity. Go about activity as told by your doctor.  · Have help at home.  · Write down how many pads you use each day. Write down how soaked they are.  · Do not use tampons. Do not wash out your vagina (douche) or have sex (intercourse) until your doctor approves.  · Only take medicine as told by your doctor.  · Do not take aspirin.  · Keep all doctor visits as told.  · If you or your partner have problems with grieving, talk to your doctor. You can also try counseling. Give yourself time to grieve before trying to get pregnant again.  GET HELP RIGHT AWAY IF:  · You have bad cramps or pain in your back or belly (abdomen).  · You have a fever.  · You pass large clumps of blood (clots) from your vagina that are walnut-sized or larger. Save the clumps for your doctor to see.  · You pass large amounts of tissue from your vagina. Save the tissue for your doctor to see.  · You have more bleeding.  · You have thick, bad-smelling fluid (discharge) coming from the vagina.  · You get lightheaded, weak, or you pass out (faint).  · You have chills.  MAKE SURE YOU:  · Understand these instructions.  · Will watch your condition.  · Will get help right away if you are not doing well or get worse.  Document Released: 10/02/2011 Document Reviewed: 10/02/2011  Exit[employer]® Patient Information ©2014 [employer].

## 2013-09-12 NOTE — MAU Note (Signed)
Was seen in MAU yesterday for pain and bleeding. Dx early pregnancy; probable failed pregnancy. States today, pain and bleeding are worse.

## 2013-09-15 ENCOUNTER — Encounter: Payer: Self-pay | Admitting: *Deleted

## 2013-09-19 ENCOUNTER — Other Ambulatory Visit: Payer: Self-pay

## 2013-09-24 ENCOUNTER — Telehealth: Payer: Self-pay | Admitting: Obstetrics & Gynecology

## 2013-09-24 NOTE — Telephone Encounter (Signed)
Patient missed last appointment. Called and left message for her to come in 09/25/2013 between 8am and 11:00. Left message on voicemail.

## 2013-09-25 ENCOUNTER — Other Ambulatory Visit: Payer: Self-pay

## 2013-12-12 ENCOUNTER — Ambulatory Visit: Payer: Self-pay | Admitting: Physician Assistant

## 2014-01-12 ENCOUNTER — Encounter (HOSPITAL_COMMUNITY): Payer: Self-pay

## 2014-02-05 ENCOUNTER — Ambulatory Visit: Payer: Self-pay | Admitting: Physician Assistant

## 2014-03-26 ENCOUNTER — Ambulatory Visit: Payer: Self-pay | Admitting: Primary Care

## 2014-05-25 ENCOUNTER — Encounter (HOSPITAL_COMMUNITY): Payer: Self-pay

## 2014-06-26 ENCOUNTER — Encounter (HOSPITAL_COMMUNITY): Payer: Self-pay | Admitting: *Deleted

## 2014-07-29 ENCOUNTER — Ambulatory Visit: Payer: Self-pay | Admitting: Primary Care

## 2015-11-30 ENCOUNTER — Emergency Department
Admission: EM | Admit: 2015-11-30 | Discharge: 2015-11-30 | Disposition: A | Discharge status: Home / Self Care | Payer: Self-pay | Attending: Emergency Medicine | Admitting: Emergency Medicine

## 2015-11-30 ENCOUNTER — Encounter: Payer: Self-pay | Admitting: Emergency Medicine

## 2015-11-30 ENCOUNTER — Emergency Department: Payer: Self-pay

## 2015-11-30 DIAGNOSIS — Q613 Polycystic kidney, unspecified: Secondary | ICD-10-CM | POA: Insufficient documentation

## 2015-11-30 DIAGNOSIS — R109 Unspecified abdominal pain: Secondary | ICD-10-CM

## 2015-11-30 DIAGNOSIS — Z79899 Other long term (current) drug therapy: Secondary | ICD-10-CM | POA: Insufficient documentation

## 2015-11-30 DIAGNOSIS — Z87891 Personal history of nicotine dependence: Secondary | ICD-10-CM | POA: Insufficient documentation

## 2015-11-30 DIAGNOSIS — N289 Disorder of kidney and ureter, unspecified: Secondary | ICD-10-CM | POA: Insufficient documentation

## 2015-11-30 LAB — CBC
HEMATOCRIT: 35.5 % (ref 35.0–47.0)
HEMOGLOBIN: 11.6 g/dL — AB (ref 12.0–16.0)
MCH: 27.4 pg (ref 26.0–34.0)
MCHC: 32.8 g/dL (ref 32.0–36.0)
MCV: 83.6 fL (ref 80.0–100.0)
Platelets: 228 10*3/uL (ref 150–440)
RBC: 4.25 MIL/uL (ref 3.80–5.20)
RDW: 15.5 % — ABNORMAL HIGH (ref 11.5–14.5)
WBC: 9.5 10*3/uL (ref 3.6–11.0)

## 2015-11-30 LAB — COMPREHENSIVE METABOLIC PANEL
ALT: 69 U/L — ABNORMAL HIGH (ref 14–54)
ANION GAP: 6 (ref 5–15)
AST: 79 U/L — ABNORMAL HIGH (ref 15–41)
Albumin: 3.8 g/dL (ref 3.5–5.0)
Alkaline Phosphatase: 100 U/L (ref 38–126)
BILIRUBIN TOTAL: 0.6 mg/dL (ref 0.3–1.2)
BUN: 9 mg/dL (ref 6–20)
CHLORIDE: 108 mmol/L (ref 101–111)
CO2: 22 mmol/L (ref 22–32)
Calcium: 9.3 mg/dL (ref 8.9–10.3)
Creatinine, Ser: 0.98 mg/dL (ref 0.44–1.00)
GFR calc Af Amer: >60 mL/min (ref >60)
Glucose, Bld: 128 mg/dL — ABNORMAL HIGH (ref 65–99)
Potassium: 3.8 mmol/L (ref 3.5–5.1)
Sodium: 136 mmol/L (ref 135–145)
Total Protein: 8.2 g/dL — ABNORMAL HIGH (ref 6.5–8.1)

## 2015-11-30 LAB — LIPASE, BLOOD: Lipase: 29 U/L (ref 11–51)

## 2015-11-30 LAB — POCT PREGNANCY, URINE: Preg Test, Ur: NEGATIVE

## 2015-11-30 MED ORDER — OXYCODONE-ACETAMINOPHEN 5-325 MG PO TABS
2.0000 | ORAL_TABLET | Freq: Once | ORAL | Status: AC
  Administered 2015-11-30: 2 via ORAL
  Filled 2015-11-30: qty 2

## 2015-11-30 MED ORDER — IBUPROFEN 800 MG PO TABS
800.0000 mg | ORAL_TABLET | Freq: Once | ORAL | Status: AC
  Administered 2015-11-30: 800 mg via ORAL
  Filled 2015-11-30: qty 1

## 2015-11-30 MED ORDER — KETOROLAC TROMETHAMINE 30 MG/ML IJ SOLN: 30.0000 mg | Freq: Once | INTRAMUSCULAR | Status: DC

## 2015-11-30 MED ORDER — OXYCODONE-ACETAMINOPHEN 5-325 MG PO TABS: 2.0000 | ORAL_TABLET | Freq: Four times a day (QID) | ORAL | Status: DC | PRN

## 2015-11-30 NOTE — ED Notes (Signed)
Pt reports that her pain started on Friday and that she did NOT come to ED or seek treatment. Pt now has reduced urinary symptoms but her lower abdomen hurts on both sides. Pt alert & oriented with NAD noted.

## 2015-11-30 NOTE — ED Notes (Signed)
Pt alert and oriented X4, active, cooperative, pt in NAD. RR even and unlabored, color WNL.  Pt informed to return if any life threatening symptoms occur.   

## 2015-11-30 NOTE — ED Provider Notes (Signed)
[facility] Emergency Department Provider Note        Time seen: ----------------------------------------- 9:41 AM on 11/30/2015 -----------------------------------------    I have reviewed the triage vital signs and the nursing notes.   HISTORY  Chief Complaint Flank Pain and Abdominal Pain    HPI Shannon Barnett is a 25 y.o. female who presents ER for left flank pain that started Friday. Patient did not come to the ER see treatment that time. Patient now has some stinging with urination but previously was much more painful. She has sharp pain in the left flank. Does report history kidney problems whenever she drinks dark sodas. She has had fevers and chills.   Past Medical History  Diagnosis Date  . Renal disorder     There are no active problems to display for this patient.   History reviewed. No pertinent past surgical history.  Allergies Penicillins  Social History Social History  Substance Use Topics  . Smoking status: Former Games developermoker  . Smokeless tobacco: None  . Alcohol Use: No    Review of Systems Constitutional: Positive for fever and chills Eyes: Negative for visual changes. ENT: Negative for sore throat. Cardiovascular: Negative for chest pain. Respiratory: Negative for shortness of breath. Gastrointestinal: Positive for left flank pain Genitourinary: Positive for dysuria Musculoskeletal: Positive for left-sided low back pain Skin: Negative for rash. Neurological: Negative for headaches, focal weakness or numbness.  10-point ROS otherwise negative.  ____________________________________________   PHYSICAL EXAM:  VITAL SIGNS: ED Triage Vitals  Enc Vitals Group     BP 11/30/15 0846 131/80 mmHg     Pulse Rate 11/30/15 0846 87     Resp 11/30/15 0846 20     Temp 11/30/15 0846 97.8 F (36.6 C)     Temp Source 11/30/15 0846 Oral     SpO2 11/30/15 0846 100 %     Weight 11/30/15 0846 250 lb (113.399 kg)     Height  11/30/15 0846 5\' 9"  (1.753 m)     Head Cir --      Peak Flow --      Pain Score 11/30/15 0847 10     Pain Loc --      Pain Edu? --      Excl. in GC? --     Constitutional: Alert and oriented. Mild distress Eyes: Conjunctivae are normal. PERRL. Normal extraocular movements. ENT   Head: Normocephalic and atraumatic.   Nose: No congestion/rhinnorhea.   Mouth/Throat: Mucous membranes are moist.   Neck: No stridor. Cardiovascular: Normal rate, regular rhythm. No murmurs, rubs, or gallops. Respiratory: Normal respiratory effort without tachypnea nor retractions. Breath sounds are clear and equal bilaterally. No wheezes/rales/rhonchi. Gastrointestinal: Left flank tenderness, normal bowel sounds Musculoskeletal: Nontender with normal range of motion in all extremities. No lower extremity tenderness nor edema. Left CVA and left low back tenderness Neurologic:  Normal speech and language. No gross focal neurologic deficits are appreciated.  Skin:  Skin is warm, dry and intact. No rash noted. Psychiatric: Mood and affect are normal. Speech and behavior are normal.   ____________________________________________  ED COURSE:  Pertinent labs & imaging results that were available during my care of the patient were reviewed by me and considered in my medical decision making (see chart for details). Patient resents to ER with left flank pain, which basic labs, urinalysis and likely obtain imaging. ____________________________________________    LABS (pertinent positives/negatives)  Labs Reviewed  COMPREHENSIVE METABOLIC PANEL - Abnormal; Notable for the following:  Glucose, Bld 128 (*)    Total Protein 8.2 (*)    AST 79 (*)    ALT 69 (*)    All other components within normal limits  CBC - Abnormal; Notable for the following:    Hemoglobin 11.6 (*)    RDW 15.5 (*)    All other components within normal limits  LIPASE, BLOOD  POC URINE PREG, ED  POCT PREGNANCY, URINE     RADIOLOGY Images were viewed by me  CT renal protocol IMPRESSION: Adult polycystic kidney disease. Largest cyst is on the left measuring 9.1 x 8.3 cm in size. No hydronephrosis. No renal or ureteral calculi.  Small lymph nodes are noted throughout the mesenteric and retroperitoneum. This finding potentially could indicate mesenteric adenitis. No frank adenopathy is appreciable by size criteria.  Appendix appears normal. No abscess. No bowel obstruction. ____________________________________________  FINAL ASSESSMENT AND PLAN  Flank pain  Plan: Patient with labs and imaging as dictated above. Patient presents to the ER with flank pain. She has polycystic kidney disease which is possibly the cause of her flank pain. She'll be discharged with pain medicine but does not have active signs of infection. LFTs are elevated slightly of unclear significance. I will send off for hepatitis panel as well.   Emily Filbert, MD   Note: This dictation was prepared with Dragon dictation. Any transcriptional errors that result from this process are unintentional   Emily Filbert, MD 11/30/15 [phone]

## 2015-11-30 NOTE — Discharge Instructions (Signed)
Flank Pain Flank pain refers to pain that is located on the side of the body between the upper abdomen and the back. The pain may occur over a short period of time (acute) or may be long-term or reoccurring (chronic). It may be mild or severe. Flank pain can be caused by many things. CAUSES  Some of the more common causes of flank pain include:  Muscle strains.   Muscle spasms.   A disease of your spine (vertebral disk disease).   A lung infection (pneumonia).   Fluid around your lungs (pulmonary edema).   A kidney infection.   Kidney stones.   A very painful skin rash caused by the chickenpox virus (shingles).   Gallbladder disease.  HOME CARE INSTRUCTIONS  Home care will depend on the cause of your pain. In general,  Rest as directed by your caregiver.  Drink enough fluids to keep your urine clear or pale yellow.  Only take over-the-counter or prescription medicines as directed by your caregiver. Some medicines may help relieve the pain.  Tell your caregiver about any changes in your pain.  Follow up with your caregiver as directed. SEEK IMMEDIATE MEDICAL CARE IF:   Your pain is not controlled with medicine.   You have new or worsening symptoms.  Your pain increases.   You have abdominal pain.   You have shortness of breath.   You have persistent nausea or vomiting.   You have swelling in your abdomen.   You feel faint or pass out.   You have blood in your urine.  You have a fever or persistent symptoms for more than 2-3 days.  You have a fever and your symptoms suddenly get worse. MAKE SURE YOU:   Understand these instructions.  Will watch your condition.  Will get help right away if you are not doing well or get worse.   This information is not intended to replace advice given to you by your health care provider. Make sure you discuss any questions you have with your health care provider.   Document Released: 08/31/2005 Document  Revised: 04/03/2012 Document Reviewed: 02/22/2012 Elsevier Interactive Patient Education 2016 Elsevier Inc.  Polycystic Kidney Disease, Adult Polycystic kidney disease is a disease in which the kidneys grow many small fluid-filled cysts. These cysts squeeze healthy kidney tissue, hurting the function of the kidneys. Polycystic kidney disease is present at birth and often causes progressive loss of kidney function and high blood pressure (hypertension). In milder forms of the disease, kidney function may be adequate throughout life.  CAUSES  The condition is usually inherited from parents. SIGNS AND SYMPTOMS  Hypertension.   Not enough healthy red blood cells to carry adequate oxygen to your tissues (anemia).   Pain in middle and side of the back below ribs (flank pain). This can happen if a cyst is bleeding.   Blood in the urine.   Kidney failure.   Kidney stones.   Increased urination at night.   Liver disease and liver cysts.  DIAGNOSIS  Your health care provider will ask you about your history and symptoms and perform a physical exam. Tests may be done to diagnose the condition. They may include an ultrasound, a CT scan, or an MRI scan. Sometimes chromosome studies are done to see if you have the genes to have polycystic kidneys.  TREATMENT   There is no treatment at this time to keep cysts from forming or getting larger.  Cysts may need to be drained with a needle  if they are large, putting pressure on other organs, and further destroying kidney tissue. This may require surgery.  Hypertension may be treated with medicines. Treating hypertension slows down further damage to the kidneys.  If kidney failure occurs, dialysis or transplantation is used to treat the disease. HOME CARE INSTRUCTIONS You should follow up with a kidney specialist (nephrologist) so that your kidney function is monitored. SEEK MEDICAL CARE IF:  You have severe pain in the flank area.  You  have blood in your urine.   This information is not intended to replace advice given to you by your health care provider. Make sure you discuss any questions you have with your health care provider.   Document Released: 04/04/2001 Document Revised: 11/24/2014 Document Reviewed: 12/12/2012 Elsevier Interactive Patient Education [employer] Elsevier Inc.

## 2015-11-30 NOTE — ED Notes (Signed)
Pt to ed with c/o left flank pain on Friday and now generalized abd pain. Denies n/v/d.  Last bm was this am wnl.  Pt also reports body aches and fever.

## 2016-05-25 ENCOUNTER — Other Ambulatory Visit: Payer: Self-pay | Admitting: Primary Care

## 2016-05-25 DIAGNOSIS — Z3482 Encounter for supervision of other normal pregnancy, second trimester: Secondary | ICD-10-CM

## 2016-06-02 ENCOUNTER — Ambulatory Visit: Admission: RE | Admit: 2016-06-02 | Payer: Self-pay | Source: Ambulatory Visit

## 2016-07-02 ENCOUNTER — Encounter: Payer: Self-pay | Admitting: Emergency Medicine

## 2016-07-02 ENCOUNTER — Emergency Department: Payer: Medicaid Other

## 2016-07-02 DIAGNOSIS — R1032 Left lower quadrant pain: Secondary | ICD-10-CM | POA: Diagnosis not present

## 2016-07-02 DIAGNOSIS — W000XXA Fall on same level due to ice and snow, initial encounter: Secondary | ICD-10-CM | POA: Insufficient documentation

## 2016-07-02 DIAGNOSIS — Y939 Activity, unspecified: Secondary | ICD-10-CM | POA: Insufficient documentation

## 2016-07-02 DIAGNOSIS — Y999 Unspecified external cause status: Secondary | ICD-10-CM | POA: Diagnosis not present

## 2016-07-02 DIAGNOSIS — Z87891 Personal history of nicotine dependence: Secondary | ICD-10-CM | POA: Diagnosis not present

## 2016-07-02 DIAGNOSIS — Z3A19 19 weeks gestation of pregnancy: Secondary | ICD-10-CM | POA: Diagnosis not present

## 2016-07-02 DIAGNOSIS — O9A212 Injury, poisoning and certain other consequences of external causes complicating pregnancy, second trimester: Secondary | ICD-10-CM | POA: Insufficient documentation

## 2016-07-02 DIAGNOSIS — Y929 Unspecified place or not applicable: Secondary | ICD-10-CM | POA: Diagnosis not present

## 2016-07-02 LAB — POCT PREGNANCY, URINE: Preg Test, Ur: POSITIVE — AB

## 2016-07-02 NOTE — ED Triage Notes (Signed)
Pt says she slipped on the ice last evening and fell to her left side; pt is 4978w2d pregnant; says she has not felt the baby move today; c/o pain only to that area of her abd;

## 2016-07-03 ENCOUNTER — Emergency Department
Admission: EM | Admit: 2016-07-03 | Discharge: 2016-07-03 | Disposition: A | Discharge status: Home / Self Care | Payer: Medicaid Other | Attending: Emergency Medicine | Admitting: Emergency Medicine

## 2016-07-03 DIAGNOSIS — W19XXXA Unspecified fall, initial encounter: Secondary | ICD-10-CM

## 2016-07-03 NOTE — Discharge Instructions (Signed)
You have been seen in the emergency department following a fall. Her ultrasound today has shown a normal intrauterine pregnancy, with a normal heart rate. Please follow-up with your OB/GYN this week for recheck/reevaluation. Return to the emergency department for any vaginal bleeding or fluid leakage and he other symptom personally concerning to yourself.

## 2016-07-03 NOTE — ED Provider Notes (Signed)
[facility] Emergency Department Provider Note  Time seen: 12:57 AM  I have reviewed the triage vital signs and the nursing notes.   HISTORY  Chief Complaint Abdominal Pain and Fall    HPI Shannon Barnett is a 25 y.o. female who presents the emergency department after a fall. According to the patient she slipped on the ice landing on the left side of her abdomen. States she had been feeling the baby move around but has not felt the baby move around since falling last night. States mild left lower quadrant abdominal pain, denies vaginal bleeding, or discharge.  Past Medical History:  Diagnosis Date  . Renal disorder     There are no active problems to display for this patient.   History reviewed. No pertinent surgical history.  Prior to Admission medications   Medication Sig Start Date End Date Taking? Authorizing Provider  Prenatal Vit-Fe Fumarate-FA (PRENATAL MULTIVITAMIN) TABS tablet Take 1 tablet by mouth daily at 12 noon.   Yes Historical Provider, MD    Allergies  Allergen Reactions  . Penicillins Anaphylaxis    History reviewed. No pertinent family history.  Social History Social History  Substance Use Topics  . Smoking status: Former Games developermoker  . Smokeless tobacco: Never Used  . Alcohol use No    Review of Systems Constitutional: Negative for fever. Cardiovascular: Negative for chest pain. Respiratory: Negative for shortness of breath. Gastrointestinal: Mild left-sided abdominal pain. Genitourinary: Negative for dysuria. Neurological: Negative for headache 10-point ROS otherwise negative.  ____________________________________________   PHYSICAL EXAM:  VITAL SIGNS: ED Triage Vitals  Enc Vitals Group     BP 07/02/16 2141 121/71     Pulse Rate 07/02/16 2141 76     Resp 07/02/16 2141 18     Temp 07/02/16 2141 98.5 F (36.9 C)     Temp Source 07/02/16 2141 Oral     SpO2 07/02/16 2141 100 %     Weight 07/02/16 2142 275 lb  (124.7 kg)     Height 07/02/16 2142 5\' 10"  (1.778 m)     Head Circumference --      Peak Flow --      Pain Score 07/02/16 2142 8     Pain Loc --      Pain Edu? --      Excl. in GC? --     Constitutional: Alert and oriented. Well appearing and in no distress. Eyes: Normal exam ENT   Head: Normocephalic and atraumatic   Mouth/Throat: Mucous membranes are moist. Cardiovascular: Normal rate, regular rhythm. No murmur Respiratory: Normal respiratory effort without tachypnea nor retractions. Breath sounds are clear  Gastrointestinal: Soft, minimal left lower quadrant tenderness to palpation. No rebound or guarding. No distention. Musculoskeletal: Nontender with normal range of motion in all extremities.  Neurologic:  Normal speech and language. No gross focal neurologic deficits  Skin:  Skin is warm, dry and intact.  Psychiatric: Mood and affect are normal.  ____________________________________________     RADIOLOGY  Ultrasound consistent with live intrauterine pregnancy with a normal heart rate.  ____________________________________________   INITIAL IMPRESSION / ASSESSMENT AND PLAN / ED COURSE  Pertinent labs & imaging results that were available during my care of the patient were reviewed by me and considered in my medical decision making (see chart for details).  The patient presents the emergency department with mild left lower quadrant abdominal pain after falling onto the left abdomen last night. Patient is 19 weeks and 2 days pregnant, states  she had been feeling the baby move around but has not felt the baby move since last night. Patient was concerned so she came to the emergency department for evaluation. Patient has an obstetrician Dr. Seward Carolubio, which whom she follows up with, but as it was the weekend she came to the emergency department for expedient evaluation. Overall the patient appears well, no distress, minimal tenderness, no obvious signs of trauma on exam. We  will obtain an ultrasound to evaluate.  Ultrasound consistent with live IUP with a heart rate of 150 bpm. Patient appears very well. We'll discharge home with routine OB follow-up.  ____________________________________________   FINAL CLINICAL IMPRESSION(S) / ED DIAGNOSES  Thresa RossFall    Shannon Fortune, MD 07/03/16 [phone]

## 2017-02-22 IMAGING — CT CT RENAL STONE PROTOCOL
3 of 4 series · 8 of 46 positions shown, 15 images · non-contrast
Comparison: None.

CLINICAL DATA: Left flank pain progressing to generalized pain

EXAM:
CT ABDOMEN AND PELVIS WITHOUT CONTRAST
TECHNIQUE: Multidetector CT imaging of the abdomen and pelvis was performed
following the standard protocol without oral or intravenous contrast
material administration.

[Series 4: lung · axial · 0.75mm/px · z∈[+85,+160]mm · 4 of 25 slices shown, 9 images]
[im 5/25  soft-tissue]
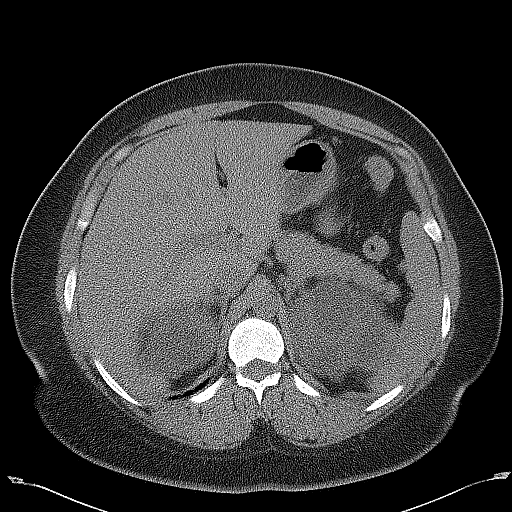
[im 5/25  lung]
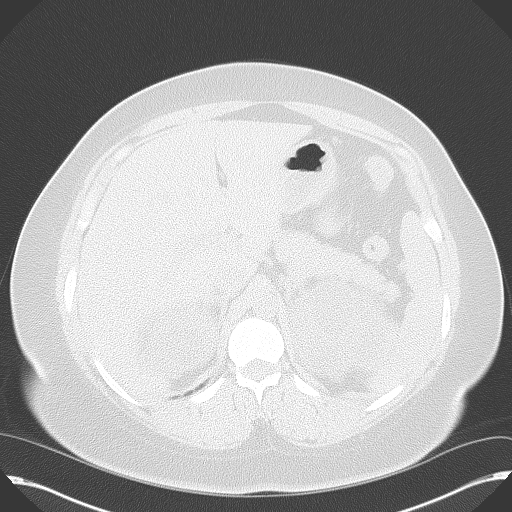
[im 5/25  bone]
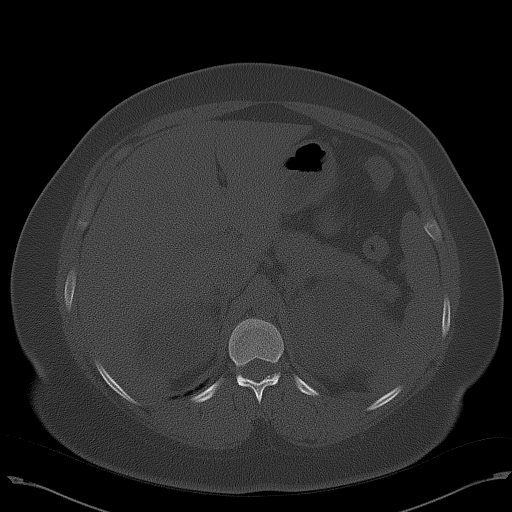
[im 10/25  soft-tissue]
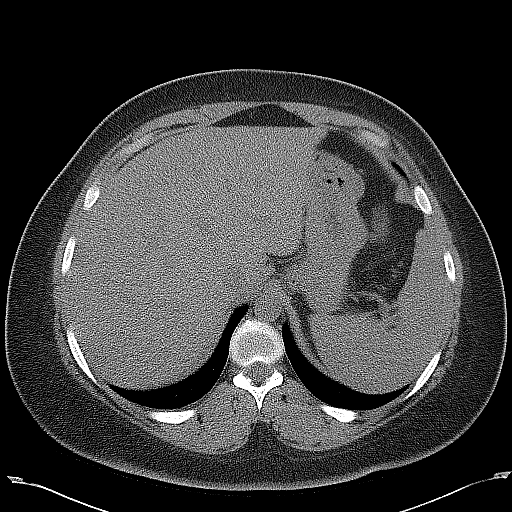
[im 10/25  lung]
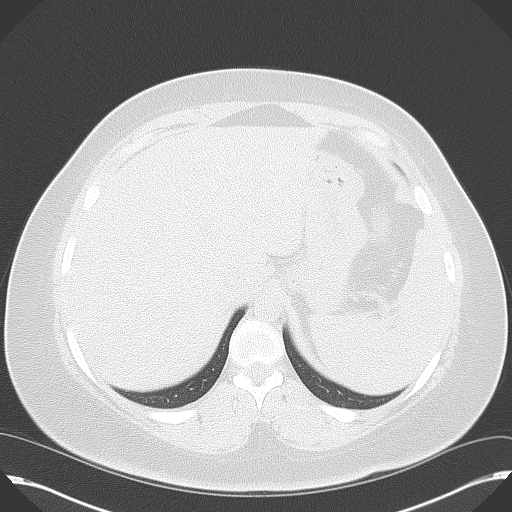
[im 15/25  soft-tissue]
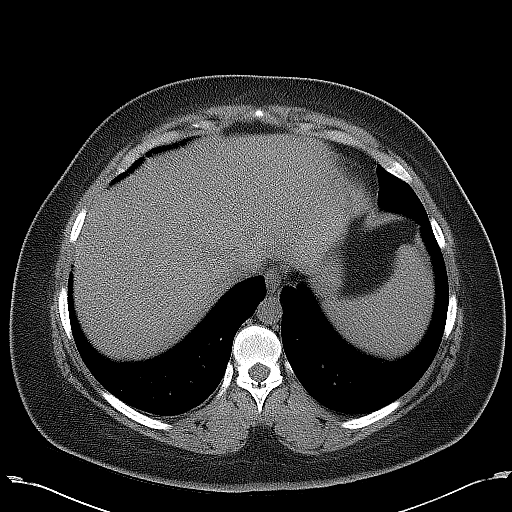
[im 15/25  lung]
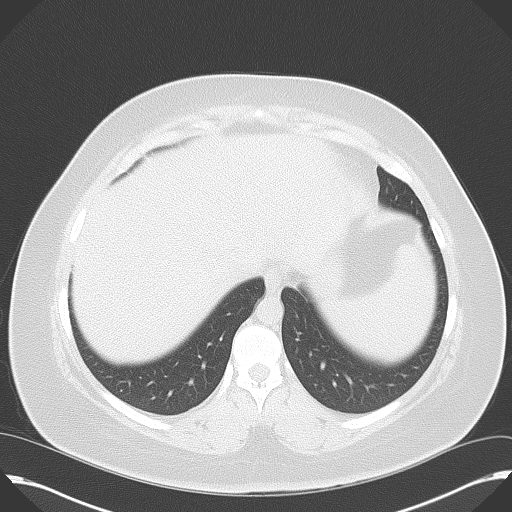
[im 20/25  soft-tissue]
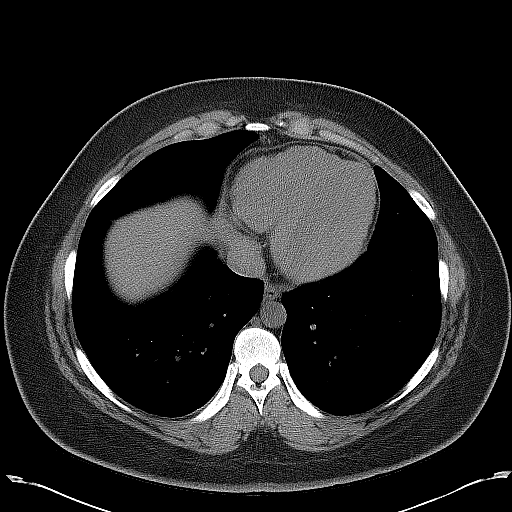
[im 20/25  lung]
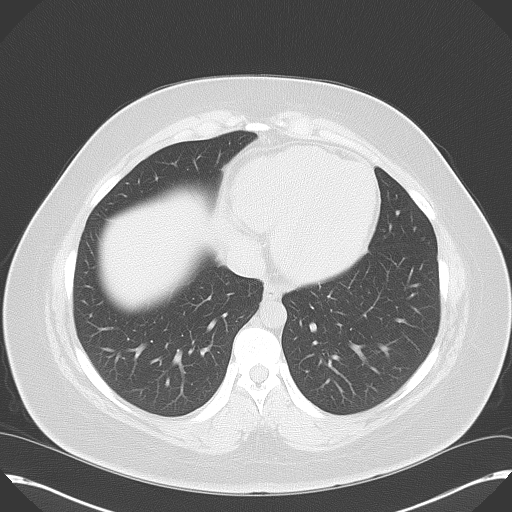

[Series 5: coronal · coronal · 0.72mm/px · 3 of 161 slices shown, 4 images]
[im 54/161  soft-tissue]
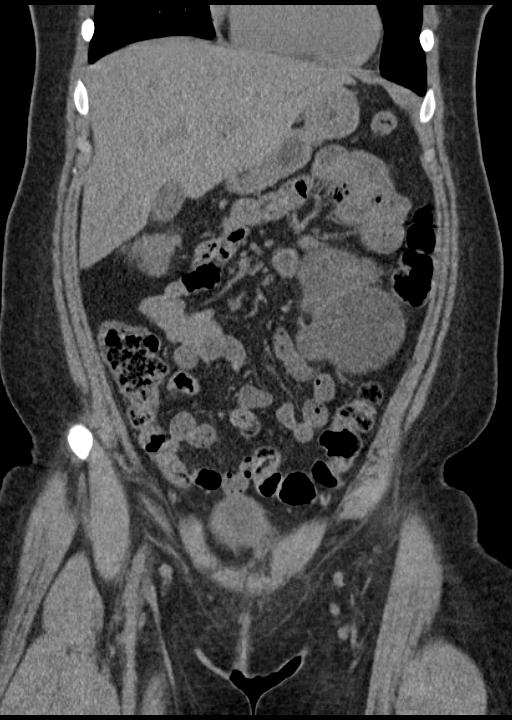
[im 72/161  soft-tissue]
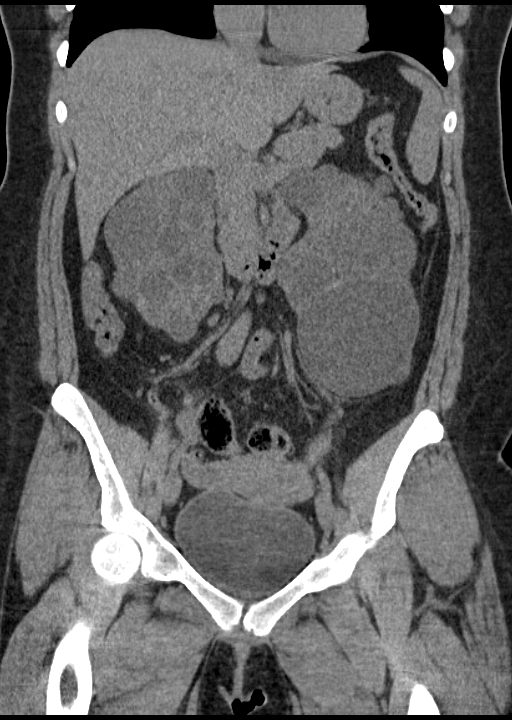
[im 72/161  bone]
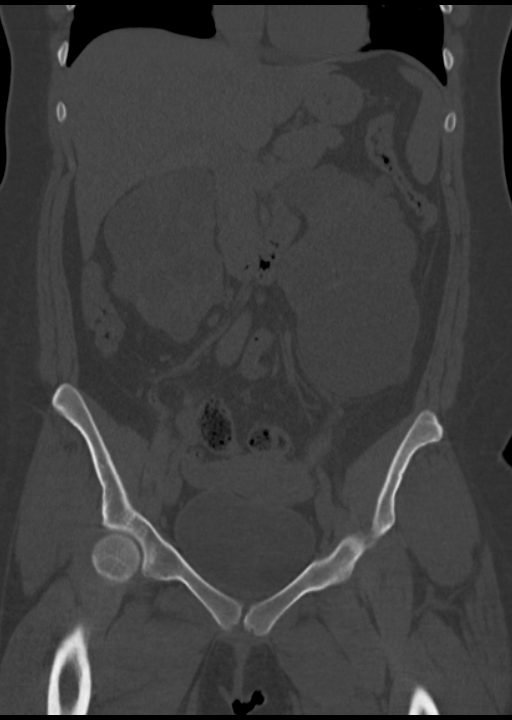
[im 89/161  soft-tissue]
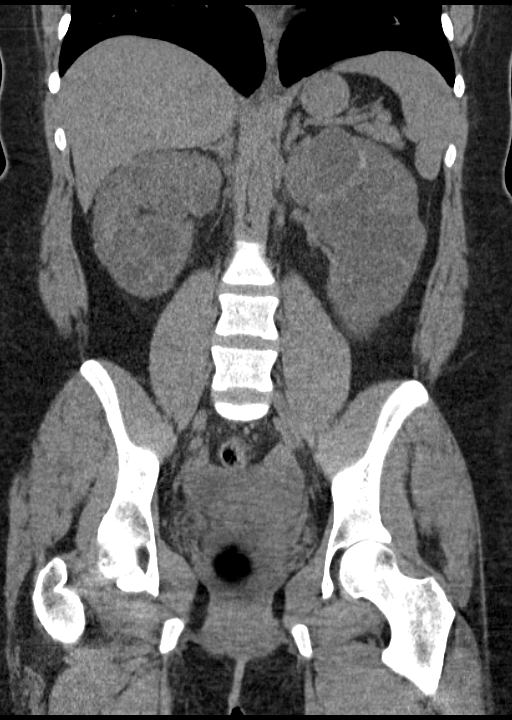

[Series 6: sagittal · sagittal · 0.64mm/px · 1 of 187 slices shown, 2 images]
[im 63/187  soft-tissue]
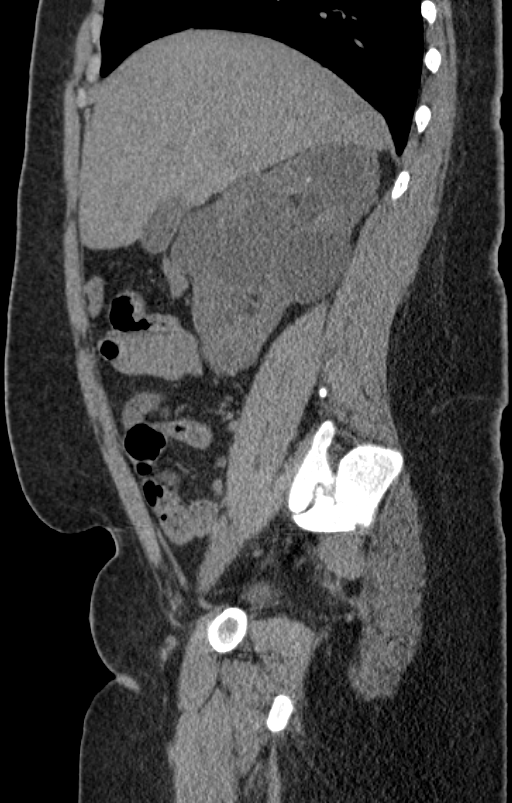
[im 63/187  bone]
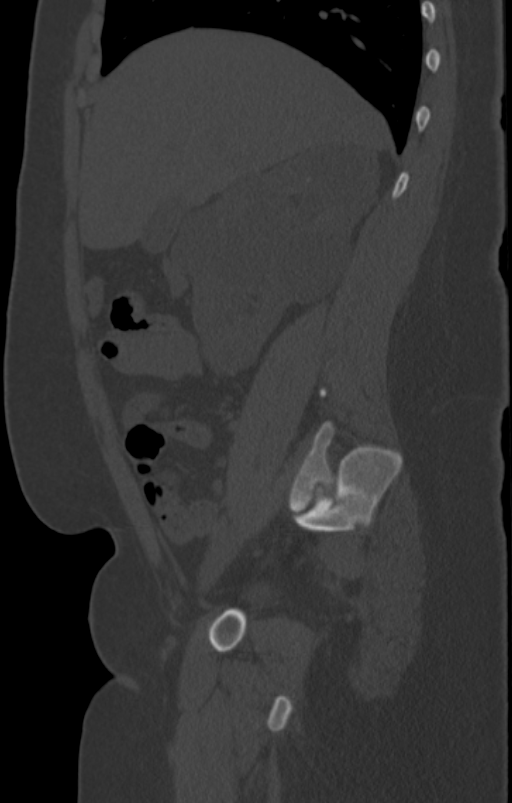

[8 of 46 positions shown; findings below may reference images not displayed]

FINDINGS: Lower chest:  Lung bases are clear.

Hepatobiliary: No focal liver lesions are apparent. Gallbladder wall
is not appreciably thickened. No biliary duct dilatation.

Pancreas: No pancreatic mass or inflammatory focus.

Spleen: No splenic lesions are evident.

Adrenals/Urinary Tract: Adrenals appear normal bilaterally. There
are multiple cysts throughout both kidneys consistent with adult
polycystic kidney disease. The largest individual cyst is on the
left arising from the lower pole region measuring 9.1 x 8.3 cm.
Largest individual cyst on the right measures 4.9 x 5.0 cm. There is
no appreciable hydronephrosis on either side. There are a few
scattered calcifications in the kidneys, likely sequelae of the
polycystic kidney disease. No well-defined calculi are identified in
either kidney. There are no ureteral calculi on either side. Urinary
bladder is midline with wall thickness within normal limits.

Stomach/Bowel: There is no bowel wall or mesenteric thickening.
Rectum is mildly distended with stool and air. Rectal wall is not
thickened. No bowel obstruction. No free air or portal venous air.

Vascular/Lymphatic: No abdominal aortic aneurysm. No vascular
lesions are evident on this study. Multiple small mesenteric and
retroperitoneal lymph nodes are noted. Subcentimeter inguinal lymph
nodes are also noted. By size criteria, there is no frank adenopathy
in the abdomen or pelvis.

Reproductive: Uterus is anteverted. There is no pelvic mass or
pelvic fluid collection.

Other: Appendix appears normal. No abscess or ascites is noted in
the abdomen or pelvis.

Musculoskeletal: There are no blastic or lytic bone lesions. No
intramuscular or abdominal wall lesion.
IMPRESSION: Adult polycystic kidney disease. Largest cyst is on the left
measuring 9.1 x 8.3 cm in size. No hydronephrosis. No renal or
ureteral calculi.

Small lymph nodes are noted throughout the mesenteric and
retroperitoneum. This finding potentially could indicate mesenteric
adenitis. No frank adenopathy is appreciable by size criteria.

Appendix appears normal.  No abscess.  No bowel obstruction.

## 2017-09-25 IMAGING — US US OB LIMITED
1 series · 14 of 26 positions shown · non-contrast
Comparison: none

CLINICAL DATA: Patient fell onto left side and has not felt fetus
move today. No bleeding. Second trimester pregnancy.

EXAM:
LIMITED OBSTETRIC ULTRASOUND

[Series 1: us ob limited · 0.26mm/px · 14 of 26 slices shown]
[im 1/26]
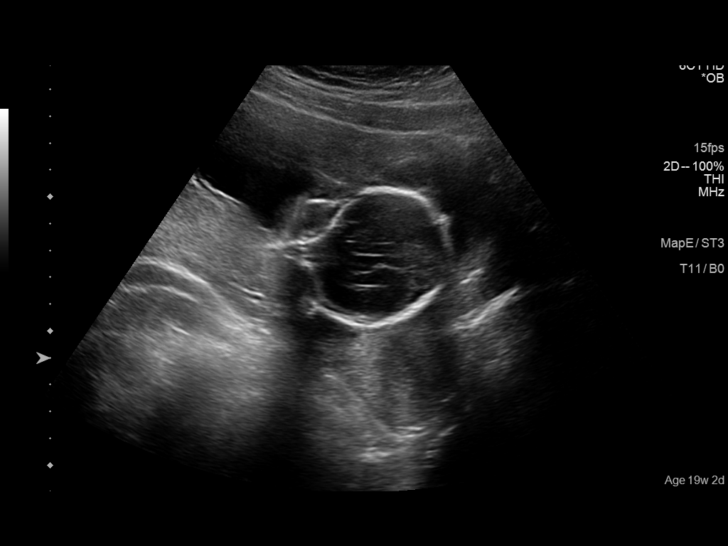
[im 3/26]
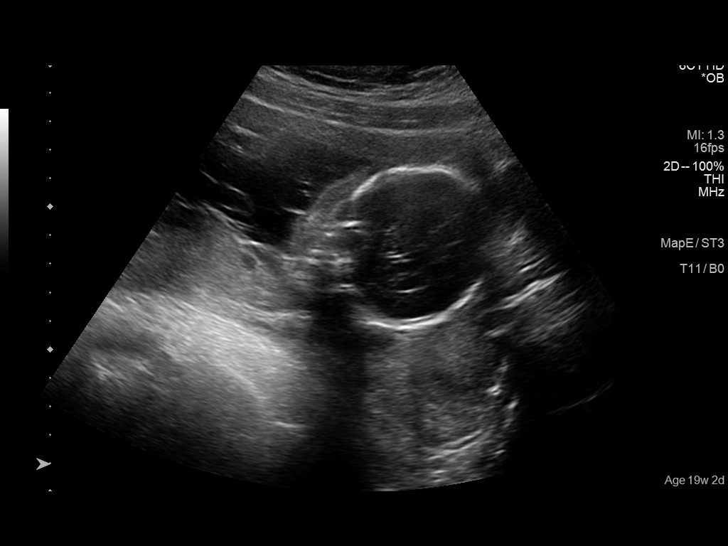
[im 5/26]
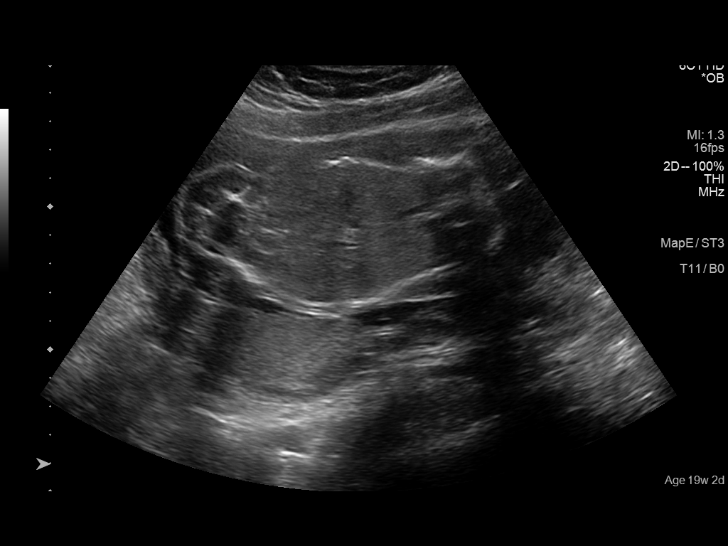
[im 7/26]
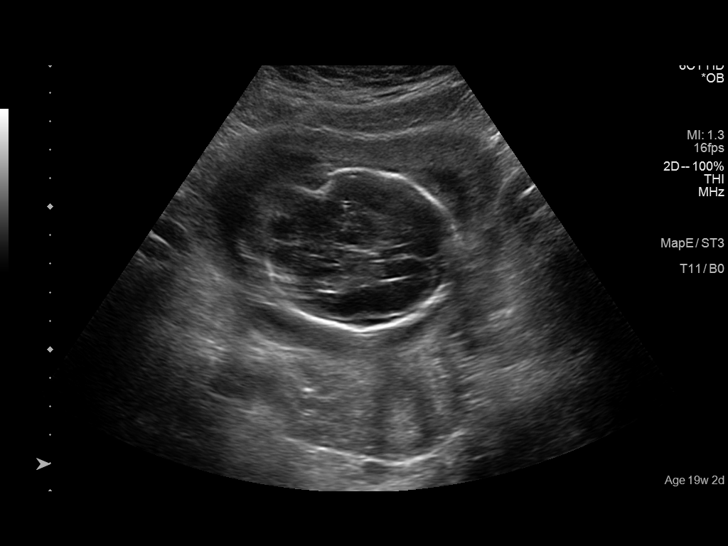
[im 9/26]
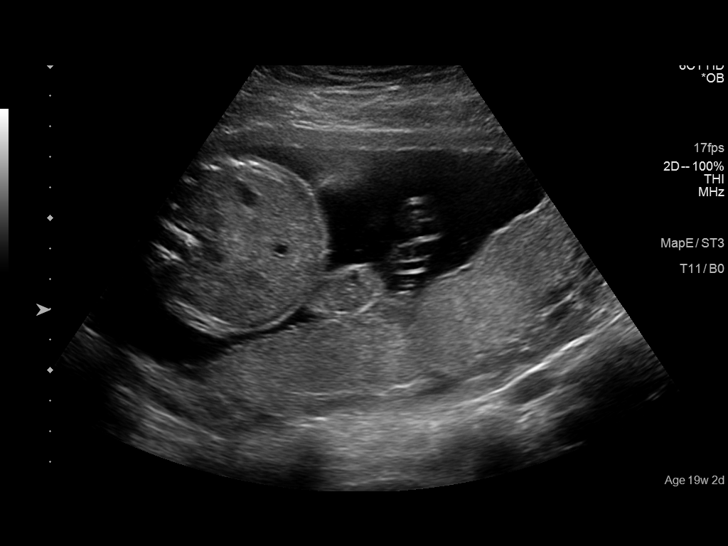
[im 11/26]
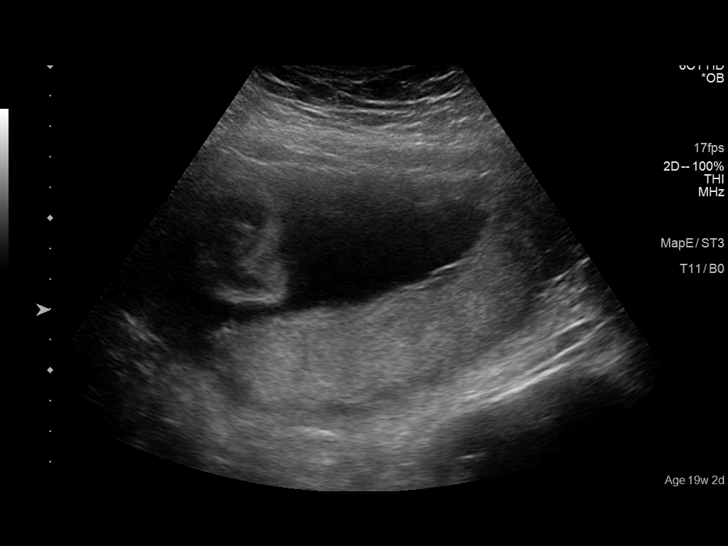
[im 13/26]
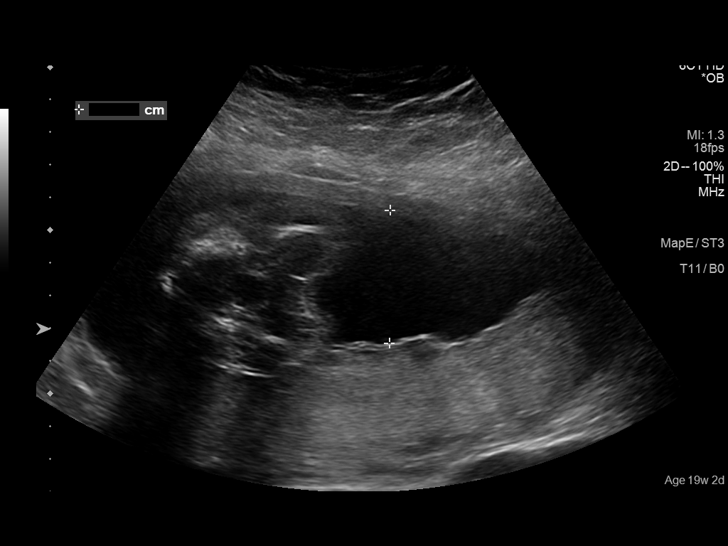
[im 14/26]
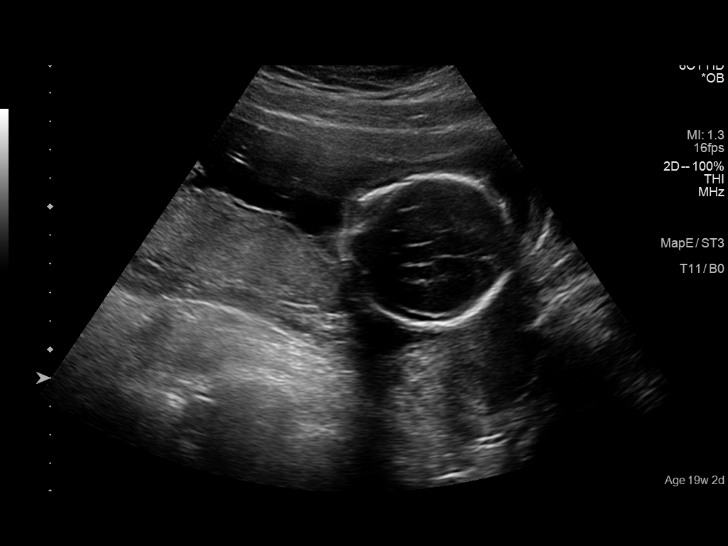
[im 16/26]
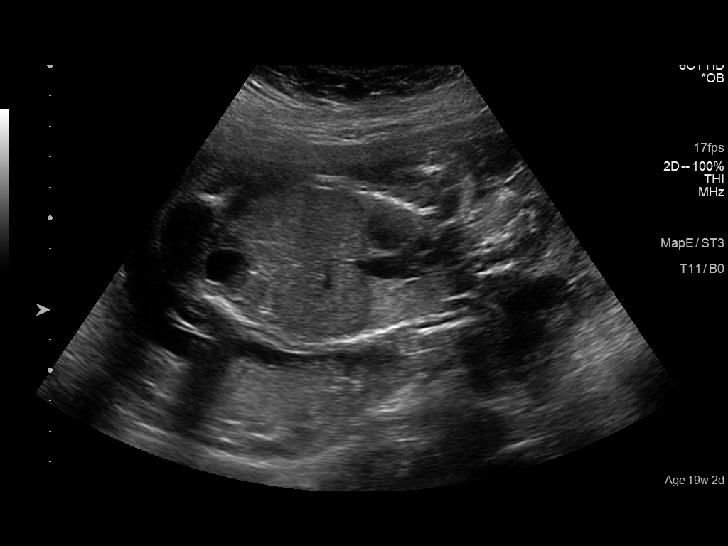
[im 18/26]
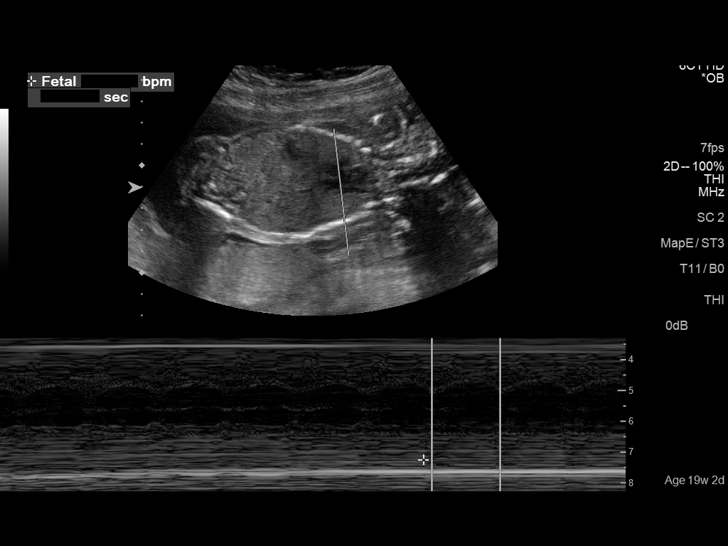
[im 20/26]
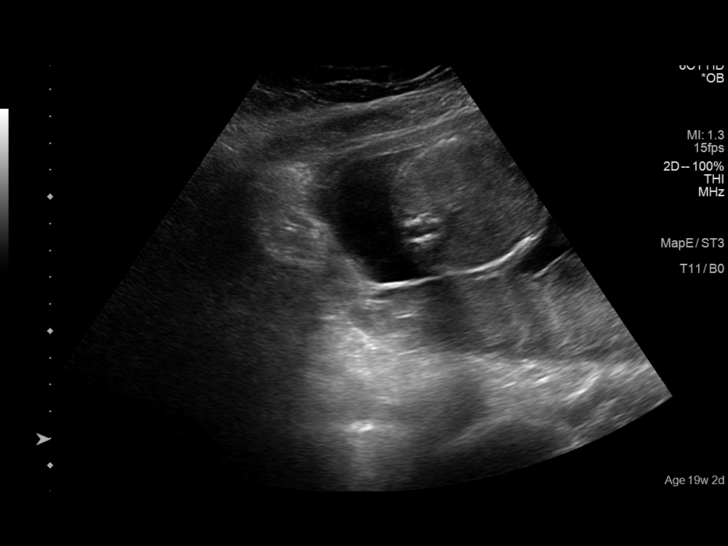
[im 22/26]
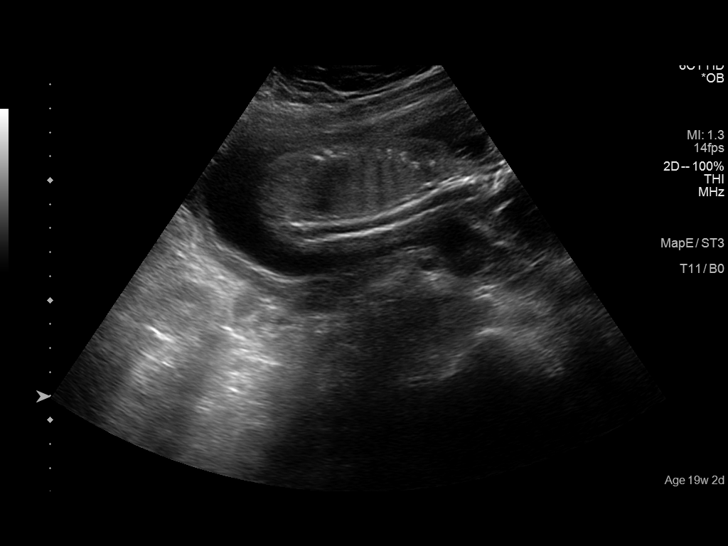
[im 24/26]
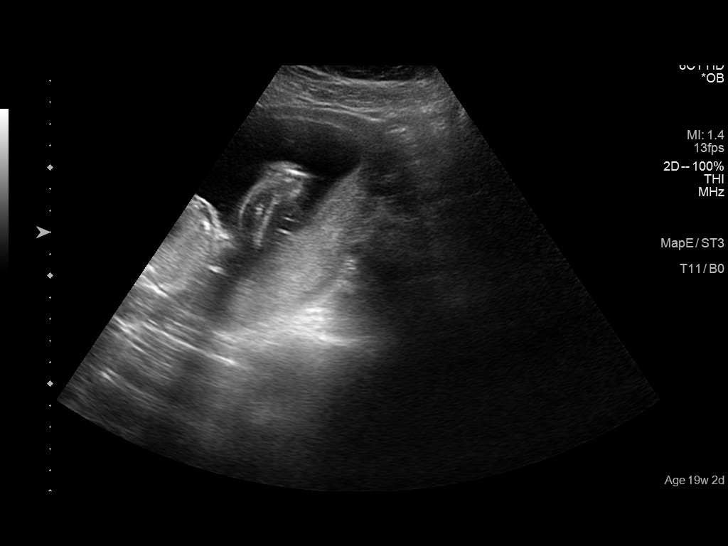
[im 26/26]
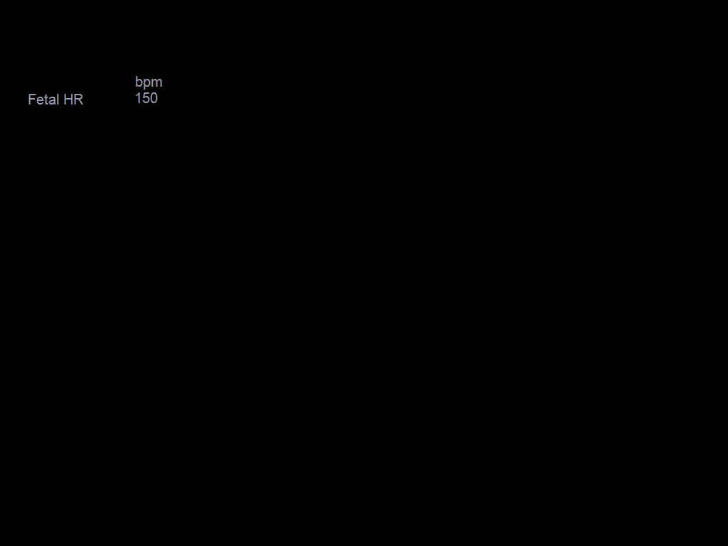

[14 of 26 positions shown; findings below may reference images not displayed]

FINDINGS: Number of Fetuses: 1

Heart Rate:  150 bpm

Movement: Yes

Presentation: Cephalic

Placental Location: Posterior

Previa: No

Amniotic Fluid (Subjective):  Move Within normal limits.

BPD:  5.5cm 22w  6d

MATERNAL FINDINGS:

Cervix:  Appears closed.

Uterus/Adnexae:  The ovaries were not visualized.
IMPRESSION: Single live intrauterine gestation with heart rate of 150 beats per
minute. Mean ultrasound gestational age 22 weeks 6 days. Posterior
placenta without abruption or previa.

This exam is performed on an emergent basis and does not
comprehensively evaluate fetal size, dating, or anatomy; follow-up
complete OB US should be considered if further fetal assessment is
warranted.

## 2017-11-20 ENCOUNTER — Encounter: Payer: Self-pay | Admitting: *Deleted

## 2017-11-20 ENCOUNTER — Other Ambulatory Visit: Payer: Self-pay

## 2017-11-20 ENCOUNTER — Emergency Department: Payer: Self-pay

## 2017-11-20 DIAGNOSIS — Z87891 Personal history of nicotine dependence: Secondary | ICD-10-CM | POA: Insufficient documentation

## 2017-11-20 DIAGNOSIS — Y929 Unspecified place or not applicable: Secondary | ICD-10-CM | POA: Insufficient documentation

## 2017-11-20 DIAGNOSIS — Y998 Other external cause status: Secondary | ICD-10-CM | POA: Insufficient documentation

## 2017-11-20 DIAGNOSIS — Y9389 Activity, other specified: Secondary | ICD-10-CM | POA: Insufficient documentation

## 2017-11-20 DIAGNOSIS — S93401A Sprain of unspecified ligament of right ankle, initial encounter: Secondary | ICD-10-CM | POA: Insufficient documentation

## 2017-11-20 DIAGNOSIS — W010XXA Fall on same level from slipping, tripping and stumbling without subsequent striking against object, initial encounter: Secondary | ICD-10-CM | POA: Insufficient documentation
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
## 2017-11-21 ENCOUNTER — Emergency Department
Admission: EM | Admit: 2017-11-21 | Discharge: 2017-11-21 | Disposition: A | Discharge status: Home / Self Care | Payer: Self-pay | Attending: Emergency Medicine | Admitting: Emergency Medicine

## 2017-11-21 DIAGNOSIS — S93401A Sprain of unspecified ligament of right ankle, initial encounter: Secondary | ICD-10-CM

## 2017-11-21 NOTE — ED Notes (Signed)
Ace Wrap applied to right ankle.Crutches fitted and patient educated on use with demonstration for this RN. Patient to lobby in wheelchair. Verbalized understanding of discharge instructions and follow-up care.
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
## 2017-11-21 NOTE — ED Provider Notes (Signed)
[facility] Emergency Department Provider Note  ____________________________________________   First MD Initiated Contact with Patient 11/21/17 0151     (approximate)  I have reviewed the triage vital signs and the nursing notes.   HISTORY  Chief Complaint Ankle Pain    HPI Shannon Barnett is a 27 y.o. female with no contributory chronic medical history presents for evaluation of acute onset right ankle pain and swelling after tripping and falling down while carrying some boxes.  She states that she felt her ankle rotate inwards and outwards and the onset of pain was immediate and severe.  She is not able to bear weight without a significant amount of discomfort.  She has no numbness or tingling.  She did not sustain any other injuries.  There is no obvious deformity but there is some swelling and bruising.  Past Medical History:  Diagnosis Date  . Renal disorder     There are no active problems to display for this patient.   History reviewed. No pertinent surgical history.  Prior to Admission medications   Medication Sig Start Date End Date Taking? Authorizing Provider  Prenatal Vit-Fe Fumarate-FA (PRENATAL MULTIVITAMIN) TABS tablet Take 1 tablet by mouth daily at 12 noon.    [provider]    Allergies Penicillins  No family history on file.  Social History Social History   Tobacco Use  . Smoking status: Former Games developer  . Smokeless tobacco: Never Used  Substance Use Topics  . Alcohol use: No  . Drug use: No    Review of Systems Constitutional: No fever/chills Cardiovascular: Denies chest pain. Respiratory: Denies shortness of breath. Gastrointestinal: No abdominal pain.  No nausea, no vomiting.   Musculoskeletal: Pain and swelling in right ankle, particularly on the outside of the ankle, as described above Integumentary: Negative for rash. Neurological: Negative for headaches, focal weakness or  numbness.   ____________________________________________   PHYSICAL EXAM:  VITAL SIGNS: ED Triage Vitals  Enc Vitals Group     BP 11/20/17 2340 132/69     Pulse Rate 11/20/17 2340 90     Resp 11/20/17 2340 16     Temp 11/20/17 2340 98.1 F (36.7 C)     Temp Source 11/20/17 2340 Oral     SpO2 11/20/17 2340 100 %     Weight --      Height --      Head Circumference --      Peak Flow --      Pain Score 11/20/17 2339 10     Pain Loc --      Pain Edu? --      Excl. in GC? --     Constitutional: Alert and oriented. Well appearing and in no acute distress. Eyes: Conjunctivae are normal.  Head: Atraumatic. Cardiovascular: Normal rate, regular rhythm. Good peripheral circulation. Respiratory: Normal respiratory effort.  No retractions.  Musculoskeletal: Mild ecchymosis and some edema around the lateral malleolus of the right ankle.  Tenderness to palpation.  Neurovascularly intact.  No gross deformities.  Pain with attempted range of motion of the ankle. Neurologic:  Normal speech and language. No gross focal neurologic deficits are appreciated.  Skin:  Skin is warm, dry and intact. No rash noted. Psychiatric: Mood and affect are normal. Speech and behavior are normal.  ____________________________________________   LABS (all labs ordered are listed, but only abnormal results are displayed)  Labs Reviewed - No data to display ____________________________________________  EKG  None - EKG not ordered  by ED physician ____________________________________________  RADIOLOGY I, Loleta Rose, personally viewed and evaluated these images (plain radiographs) as part of my medical decision making, as well as reviewing the written report by the radiologist.  ED MD interpretation: Soft tissue swelling but no evidence of fracture or dislocation  Official radiology report(s): Dg Ankle Complete Right  Result Date: 11/21/2017 CLINICAL DATA:  Right ankle pain after a fall today. EXAM:  RIGHT ANKLE - COMPLETE 3+ VIEW COMPARISON:  None. FINDINGS: Mild soft tissue swelling about the right ankle. No evidence of acute fracture or dislocation. No focal bone lesion or bone destruction. Bone cortex appears intact. IMPRESSION: Soft tissue swelling.  No acute fracture or dislocation. Electronically Signed   By: Burman Nieves M.D.   On: 11/21/2017 00:20    ____________________________________________   PROCEDURES  Critical Care performed: No   Procedure(s) performed:   Procedures   ____________________________________________   INITIAL IMPRESSION / ASSESSMENT AND PLAN / ED COURSE  As part of my medical decision making, I reviewed the following data within the electronic MEDICAL RECORD NUMBER Nursing notes reviewed and incorporated and Radiograph reviewed     Ankle sprain, no evidence of fracture dislocation. ACE wrap/RICE, OTC analgesia, crutches, weightbearing as tolerated.  She will follow-up with her PCP but also gave contact information for orthopedics.     ____________________________________________  FINAL CLINICAL IMPRESSION(S) / ED DIAGNOSES  Final diagnoses:  Sprain of right ankle, unspecified ligament, initial encounter     MEDICATIONS GIVEN DURING THIS VISIT:  Medications - No data to display   ED Discharge Orders    None       Note:  This document was prepared using Dragon voice recognition software and may include unintentional dictation errors.    Loleta Rose, MD 11/21/17 [phone]

## 2017-11-21 NOTE — Discharge Instructions (Signed)
As we discussed, you do not have any broken or dislocated bones in your foot or ankle, but you do have an ankle sprain.  Please read through the included information about routine injury care (RICE = rest, ice, compression, elevation), and take over-the-counter pain medicine according to label instructions.  If you do not have any reason to avoid ibuprofen, you can also consider taking ibuprofen 600 mg 3 times a day with meals, but do this for no more than 5 days as it may cause to some stomach discomfort over time.  Use crutches if provided and you may bear weight as tolerated.  Follow-up is recommended with the orthopedic surgeon or with your regular doctor.  

## 2023-09-18 ENCOUNTER — Emergency Department
Admission: EM | Admit: 2023-09-18 | Discharge: 2023-09-18 | Disposition: A | Discharge status: Home / Self Care | Payer: BC Managed Care – PPO | Attending: Emergency Medicine | Admitting: Emergency Medicine

## 2023-09-18 ENCOUNTER — Encounter: Payer: Self-pay | Admitting: Emergency Medicine

## 2023-09-18 ENCOUNTER — Other Ambulatory Visit: Payer: Self-pay

## 2023-09-18 ENCOUNTER — Emergency Department: Payer: BC Managed Care – PPO

## 2023-09-18 DIAGNOSIS — R0789 Other chest pain: Secondary | ICD-10-CM | POA: Diagnosis not present

## 2023-09-18 DIAGNOSIS — M25511 Pain in right shoulder: Secondary | ICD-10-CM | POA: Diagnosis present

## 2023-09-18 DIAGNOSIS — Y92410 Unspecified street and highway as the place of occurrence of the external cause: Secondary | ICD-10-CM | POA: Diagnosis not present

## 2023-09-18 LAB — CBC
HCT: 38.2 % (ref 36.0–46.0)
Hemoglobin: 12.3 g/dL (ref 12.0–15.0)
MCH: 28.8 pg (ref 26.0–34.0)
MCHC: 32.2 g/dL (ref 30.0–36.0)
MCV: 89.5 fL (ref 80.0–100.0)
Platelets: 229 10*3/uL (ref 150–400)
RBC: 4.27 MIL/uL (ref 3.87–5.11)
RDW: 14.4 % (ref 11.5–15.5)
WBC: 9 10*3/uL (ref 4.0–10.5)
nRBC: 0 % (ref 0.0–0.2)

## 2023-09-18 LAB — BASIC METABOLIC PANEL
Anion gap: 7 (ref 5–15)
BUN: 18 mg/dL (ref 6–20)
CO2: 20 mmol/L — ABNORMAL LOW (ref 22–32)
Calcium: 8.6 mg/dL — ABNORMAL LOW (ref 8.9–10.3)
Chloride: 107 mmol/L (ref 98–111)
Creatinine, Ser: 1.02 mg/dL — ABNORMAL HIGH (ref 0.44–1.00)
GFR, Estimated: >60 mL/min (ref >60)
Glucose, Bld: 96 mg/dL (ref 70–99)
Potassium: 3.7 mmol/L (ref 3.5–5.1)
Sodium: 134 mmol/L — ABNORMAL LOW (ref 135–145)

## 2023-09-18 LAB — TROPONIN I (HIGH SENSITIVITY): Troponin I (High Sensitivity): 4 ng/L (ref <18)

## 2023-09-18 MED ORDER — CYCLOBENZAPRINE HCL 5 MG PO TABS: 5.0000 mg | ORAL_TABLET | Freq: Three times a day (TID) | ORAL | 0 refills | Status: AC | PRN
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
## 2023-09-18 NOTE — Discharge Instructions (Signed)
 Please seek medical attention for any high fevers, chest pain, shortness of breath, change in behavior, persistent vomiting, bloody stool or any other new or concerning symptoms.

## 2023-12-03 NOTE — Discharge Summary (Signed)
 ------------------------------------------------------------------------------- Attestation signed by Myrex, Palee Lashell, MD at 12/04/23 1727 Discharge Attestation: I saw and evaluated the patient, participating in the key portions of the service on the day of discharge.  I reviewed the resident's note and agree with the discharge plans and disposition. I personally spent greater than 30 minutes in discharge planning services.    Palee LaShell Myrex, MD      -------------------------------------------------------------------------------   Physician Discharge Summary [facility] [address] [place] [address] Dept: [phone] Loc: [phone]   Identifying Information:  Shannon Barnett 1991/04/10 [id number]  Primary Care Physician: Era Harlene Kung, FNP  Code Status: Full Code  Admit Date: 12/02/2023  Discharge Date: 12/04/2023   Discharge To: Home  Discharge Service: [facility] - FAM - Blue   Discharge Attending Physician: Lindia Graves Myrex, MD  Discharge Diagnoses: Principal Problem:   Gross hematuria Active Problems:   Polycystic kidney disease   H/O pyelonephritis   Acute left-sided low back pain   Outpatient Provider Follow Up Issues:  [ ]  Outpatient renal MRI to further evaluate radiological changes in known renal cysts [ ]  Close follow up with PCP and outpatient nephrology for monitoring of blood pressure and blood sugar  Hospital Course:  Shannon Barnett is a 33 y.o. female whose presentation is complicated by polycystic kidney disease, frequent UTIs and previous pyelonephritis that presented to [facility] Emergency Department with worsening bilateral back pain in the setting of recent pyelonephritis diagnosis.   Active Problems #Gross hematuria #Left-sided back pain #Anemia #Hx ADPKD Patient presented with back pain, nausea, and vomiting that started 5/7 and was admitted to the ED on 5/8 with leukocytosis and equivocal UA that may  have been contaminated. Urine culture from 5/8 grew MDR E coli susceptible to Keflex, macrobid , fluoroquinolones, and ceftriaxone. She was started on a course of Levaquin to be taken until 5/11, but returned to the ED two days later with worsening back and abdominal pain. CTAP showed polycystic kidneys without acute findings in abdomen or pelvis, but did remark on changes in echotexture in the renal cysts that may merit further workup with an outpatient renal MRI. Hemoglobin dropped one point over admission. Diagnosis of pyelonephritis was made based on back pain, nausea, and vomiting and she was treated with IV ceftriaxone 2g q24h while inpatient. However, on reevaluation, we do not think this is pyelonephritis because she never had a UTI or dysuria, does not have CVA tenderness, and CTAP did not remark on pyelonephritis. After consulting with nephrology, we believe this is more likely to be a ruptured renal cyst. Back pain is likely musculoskeletal in nature. Will discharge with adequate pain control, return precautions, and close follow up with outpatient nephrology.     #nausea and vomiting Likely 2/2 pyelonephritis as above. Vitals normal on presentation. Received 1L bolus in ED. Improved with zofran .   #rash Pruritic, excoriated, macular rash present bilaterally over the upper abdomen, not in a dermatomal distribution and not extending to the back. Based on chronicity, likely to be drug-induced from starting Levaquin last week.   Chronic Problems #polycystic kidney disease: does not appear to follow with nephrology outpatient, not on any home meds   Touchbase with Outpatient Provider: Warm Handoff: Completed on 12/04/23 by Elsie KATHEE Sharps, MD  (Intern) via [facility] Message  Procedures: None No admission procedures for hospital encounter. ______________________________________________________________________ Discharge Medications:   Your Medication List     STOP taking these medications     levoFLOXacin 750 MG tablet  Commonly known as: LEVAQUIN       START taking these medications    lidocaine  5 % patch Commonly known as: LIDODERM  Place 1 patch on the skin every twelve (12) hours. Remove & Discard patch within 12 hours or as directed by MD   ondansetron  4 MG disintegrating tablet Commonly known as: ZOFRAN -ODT Take 1 tablet (4 mg total) by mouth every eight (8) hours as needed for nausea for up to 7 days.   tizanidine 2 MG tablet Commonly known as: ZANAFLEX Take 1 tablet (2 mg total) by mouth every six (6) hours as needed.       CONTINUE taking these medications    acetaminophen  500 MG tablet Commonly known as: TYLENOL  Take 2 tablets (1,000 mg total) by mouth every eight (8) hours as needed for pain.   multivitamin, prenatal (folic acid-iron) 27-1 mg Tab Take 1 tablet by mouth daily.        Allergies: Penicillins and Nsaids (non-steroidal anti-inflammatory drug) ______________________________________________________________________ Pending Test Results (if blank, then none): Pending Labs     Order Current Status   Urine Culture In process       Most Recent Labs: Microbiology -  Microbiology Results (last day)     Procedure Component Value Date/Time Date/Time   Urine Culture [[id number]] Collected: 12/02/23 2017   Lab Status: In process Specimen: Urine from Clean Catch Updated: 12/02/23 2034       Relevant Studies/Radiology (if blank, then none): No results found. ______________________________________________________________________ Discharge Instructions:      Other Instructions     Discharge instructions     You were admitted to the hospital after developing a fever, back pain, and abdominal pain that was concerning for pyelonephritis (kidney infection). In the hospital, you received fluids, pain medication, and antibiotics to treat your symptoms. A CT scan showed no signs of kidney infection but did comment on changes to some  of your kidney cysts that the radiologists think should be evaluated further. While in the hospital, it was discovered that you developed an itchy rash that was thought to be an allergic reaction to the antibiotic you were previously prescribed, Levaquin. Your team of doctors also decided to treat you for a sprain of one of your back muscles with a muscle relaxer, tizanidine.   Please do not hesitate to return to the hospital if you develop burning or pain with urination, blood in the urine, or fever. It was a privilege to participate in your care while you were in the hospital.          Follow Up instructions and Outpatient Referrals    Ambulatory Referral to Nephrology     Reason for referral: Polycystic kidney disease patient with persistent  hematuria   Discharge instructions        ______________________________________________________________________ Discharge Day Services: BP 111/67   Pulse 78   Temp 36.7 C (98.1 F)   Resp 19   Ht 180.3 cm (5' 11)   Wt (!) 137.5 kg (303 lb 1.6 oz)   SpO2 99%   BMI 42.27 kg/m  Pt seen on the day of discharge and determined appropriate for discharge.  GEN: well appearing, lying in bed, NAD  HEENT: NCAT, MMM. EOMI. Neck: Supple. CV: Regular rate and rhythm. No murmurs/rubs/gallops. Pulm: CTAB. No wheezing, crackles, or rhonchi. Abd: Flat, nondistended. There is moderate pain to palpation in the RUQ and LUQ with mild pain in the RLQ and LLQ. No masses, guarding, or rebound tenderness. There is a macular  rash with excoriations present in the right and left inframammary regions.  Neuro: A&O x 3. No focal deficits.  Ext: No peripheral edema.  Palpable distal pulses.  Condition at Discharge: fair  Length of Discharge: I spent less than 30 mins in the discharge of this patient.   Camil Craciunescu M4   Will Claudene MD, (he/him) Resident Physician, PGY- 1 Department of Family Medicine

## 2024-07-02 ENCOUNTER — Emergency Department

## 2024-07-02 ENCOUNTER — Encounter: Payer: Self-pay | Admitting: Emergency Medicine

## 2024-07-02 ENCOUNTER — Other Ambulatory Visit: Payer: Self-pay

## 2024-07-02 ENCOUNTER — Emergency Department
Admission: EM | Admit: 2024-07-02 | Discharge: 2024-07-02 | Disposition: A | Discharge status: Home / Self Care | Attending: Emergency Medicine | Admitting: Emergency Medicine

## 2024-07-02 DIAGNOSIS — M62838 Other muscle spasm: Secondary | ICD-10-CM | POA: Insufficient documentation

## 2024-07-02 DIAGNOSIS — M542 Cervicalgia: Secondary | ICD-10-CM | POA: Diagnosis present

## 2024-07-02 MED ORDER — LIDOCAINE 5 % EX PTCH: 1.0000 | MEDICATED_PATCH | Freq: Two times a day (BID) | CUTANEOUS | 0 refills | Status: AC

## 2024-07-02 MED ORDER — ACETAMINOPHEN 325 MG PO TABS
650.0000 mg | ORAL_TABLET | Freq: Once | ORAL | Status: AC
  Administered 2024-07-02: 650 mg via ORAL
  Filled 2024-07-02: qty 2

## 2024-07-02 MED ORDER — LIDOCAINE 5 % EX PTCH
1.0000 | MEDICATED_PATCH | CUTANEOUS | Status: DC
  Administered 2024-07-02: 1 via TRANSDERMAL
  Filled 2024-07-02: qty 1

## 2024-07-02 NOTE — ED Provider Notes (Signed)
 [facility] Provider Note    Event Date/Time   First MD Initiated Contact with Patient 07/02/24 [phone]     (approximate)   History   Neck Pain   HPI  Shannon Barnett is a 33 y.o. female who presents today for evaluation of 2 days of pain to her right trapezius muscle area.  Patient reports that she sat up quickly and felt a pull in this area and has had pain ever since.  She denies paresthesias, though reports that the pain radiates to her deltoid area.  No headache or visual changes.  No weakness in her hand or arm.  She took a muscle relaxant without significant improvement of her symptoms.  No fevers or chills.  There are no active problems to display for this patient.         Physical Exam   Triage Vital Signs: ED Triage Vitals  Encounter Vitals Group     BP 07/02/24 0808 (!) 149/108     Girls Systolic BP Percentile --      Girls Diastolic BP Percentile --      Boys Systolic BP Percentile --      Boys Diastolic BP Percentile --      Pulse Rate 07/02/24 0808 (!) 54     Resp 07/02/24 0808 16     Temp 07/02/24 0808 98.2 F (36.8 C)     Temp Source 07/02/24 0808 Oral     SpO2 07/02/24 0808 98 %     Weight 07/02/24 0808 300 lb (136.1 kg)     Height 07/02/24 0808 5' 11 (1.803 m)     Head Circumference --      Peak Flow --      Pain Score 07/02/24 0807 10     Pain Loc --      Pain Education --      Exclude from Growth Chart --     Most recent vital signs: Vitals:   07/02/24 0808 07/02/24 0941  BP: (!) 149/108 (!) 144/95  Pulse: (!) 54 63  Resp: 16 18  Temp: 98.2 F (36.8 C) 98.1 F (36.7 C)  SpO2: 98% 98%    Physical Exam Vitals and nursing note reviewed.  Constitutional:      General: Awake and alert. No acute distress.    Appearance: Normal appearance. The patient is normal weight.  HENT:     Head: Normocephalic and atraumatic.     Mouth: Mucous membranes are moist.  Eyes:     General: PERRL. Normal EOMs        Right eye:  No discharge.        Left eye: No discharge.     Conjunctiva/sclera: Conjunctivae normal.  Cardiovascular:     Rate and Rhythm: Normal rate and regular rhythm.     Pulses: Normal pulses.  Pulmonary:     Effort: Pulmonary effort is normal. No respiratory distress.     Breath sounds: Normal breath sounds.  Abdominal:     Abdomen is soft. There is no abdominal tenderness. No rebound or guarding. No distention. Musculoskeletal:        General: No swelling. Normal range of motion.     Cervical back: Normal range of motion and neck supple.  Skin:    General: Skin is warm and dry.     Capillary Refill: Capillary refill takes less than 2 seconds.     Findings: No rash.  Neurological:     Mental Status: The patient  is awake and alert.      ED Results / Procedures / Treatments   Labs (all labs ordered are listed, but only abnormal results are displayed) Labs Reviewed - No data to display   EKG     RADIOLOGY I independently reviewed and interpreted imaging and agree with radiologists findings.     PROCEDURES:  Critical Care performed:   Procedures   MEDICATIONS ORDERED IN ED: Medications  lidocaine  (LIDODERM ) 5 % 1 patch (1 patch Transdermal Patch Applied 07/02/24 0827)  acetaminophen  (TYLENOL ) tablet 650 mg (650 mg Oral Given 07/02/24 0827)     IMPRESSION / MDM / ASSESSMENT AND PLAN / ED COURSE  I reviewed the triage vital signs and the nursing notes.   Differential diagnosis includes, but is not limited to, cervical radiculopathy, muscle strain, muscle spasm, rotator cuff injury.  Patient is awake and alert, hemodynamically stable and afebrile.  She is nontoxic in appearance.  She has reproducible tenderness palpation to her muscle area.  She has no radicular symptoms all the way to her fingers, they stop at her upper arm.  She has no weakness in her hand or arm, sensation is intact to light touch throughout and equal to opposite, not consistent with central cord  syndrome.  She is able to range her shoulder fully with active and passive range of motion, though has mild discomfort with O'Brien's and Hawkins tests.  Discussed with patient that most likely etiology is musculoskeletal, though she is requesting imaging.  CT of her neck obtained, for evaluation of disc herniation or other pathology, and she was treated symptomatically in the meantime.  This provided good effect.  CT scan was reassuring.  We discussed symptomatic management and return precautions.  Also recommended outpatient follow-up.  Patient understands and agrees with plan.  She requested prescription for Lidoderm  patches as this provided good relief.  This was sent to her pharmacy.  She was discharged in stable condition.   Patient's presentation is most consistent with acute complicated illness / injury requiring diagnostic workup.  Clinical Course as of 07/02/24 1247  Wed Jul 02, 2024  9072 Patient reports that she feels markedly improved with the Lidoderm  patch [JP]    Clinical Course User Index [JP] Evalyne Cortopassi E, PA-C     FINAL CLINICAL IMPRESSION(S) / ED DIAGNOSES   Final diagnoses:  Trapezius muscle spasm     Rx / DC Orders   ED Discharge Orders          Ordered    lidocaine  (LIDODERM ) 5 %  Every 12 hours        07/02/24 9071             Note:  This document was prepared using Dragon voice recognition software and may include unintentional dictation errors.   Arnie Clingenpeel E, PA-C 07/02/24 1247    Viviann Pastor, MD 07/03/24 1320

## 2024-07-02 NOTE — ED Triage Notes (Signed)
 Patient arrives ambulatory by POV c/o sharp pain to right shoulder and neck onset of Saturday night. Denies any injury.

## 2024-07-02 NOTE — Discharge Instructions (Addendum)
 You may use the patches as prescribed.  You may also take Tylenol /ibuprofen  per package to help with your symptoms.  please return for any new, worsening, or changing symptoms or other concerns.  it was a pleasure caring for you today.
